# Patient Record
Sex: Female | Born: 1950 | Hispanic: No | Marital: Married | Smoking: Former smoker
Health system: Southern US, Community
[De-identification: ages and names within clinical notes are randomized; demographics above are authoritative.]

## PROBLEM LIST (undated history)

## (undated) DIAGNOSIS — K219 Gastro-esophageal reflux disease without esophagitis: Secondary | ICD-10-CM

## (undated) DIAGNOSIS — R7303 Prediabetes: Secondary | ICD-10-CM

## (undated) DIAGNOSIS — F32A Depression, unspecified: Secondary | ICD-10-CM

## (undated) DIAGNOSIS — E119 Type 2 diabetes mellitus without complications: Secondary | ICD-10-CM

## (undated) DIAGNOSIS — R739 Hyperglycemia, unspecified: Secondary | ICD-10-CM

## (undated) DIAGNOSIS — I10 Essential (primary) hypertension: Secondary | ICD-10-CM

## (undated) DIAGNOSIS — R0600 Dyspnea, unspecified: Secondary | ICD-10-CM

## (undated) DIAGNOSIS — F419 Anxiety disorder, unspecified: Secondary | ICD-10-CM

## (undated) HISTORY — PX: ELBOW SURGERY: SHX618

## (undated) HISTORY — DX: Essential (primary) hypertension: I10

## (undated) HISTORY — DX: Gastro-esophageal reflux disease without esophagitis: K21.9

## (undated) HISTORY — DX: Type 2 diabetes mellitus without complications: E11.9

## (undated) HISTORY — DX: Depression, unspecified: F32.A

## (undated) HISTORY — PX: CHOLECYSTECTOMY: SHX55

## (undated) HISTORY — DX: Hyperglycemia, unspecified: R73.9

---

## 2002-08-08 ENCOUNTER — Encounter: Payer: Self-pay | Admitting: Internal Medicine

## 2002-08-08 ENCOUNTER — Encounter: Admission: RE | Admit: 2002-08-08 | Discharge: 2002-08-08 | Payer: Self-pay | Admitting: Internal Medicine

## 2003-06-13 ENCOUNTER — Other Ambulatory Visit: Admission: RE | Admit: 2003-06-13 | Discharge: 2003-06-13 | Payer: Self-pay | Admitting: Obstetrics and Gynecology

## 2004-11-20 ENCOUNTER — Observation Stay (HOSPITAL_COMMUNITY): Admission: RE | Admit: 2004-11-20 | Discharge: 2004-11-21 | Payer: Self-pay | Admitting: Orthopedic Surgery

## 2013-03-14 ENCOUNTER — Ambulatory Visit
Admission: RE | Admit: 2013-03-14 | Discharge: 2013-03-14 | Disposition: A | Discharge status: Home / Self Care | Payer: 59 | Source: Ambulatory Visit | Attending: Family Medicine | Admitting: Family Medicine

## 2013-03-14 ENCOUNTER — Other Ambulatory Visit: Payer: Self-pay | Admitting: Family Medicine

## 2013-03-14 DIAGNOSIS — M25561 Pain in right knee: Secondary | ICD-10-CM

## 2019-06-13 ENCOUNTER — Other Ambulatory Visit: Payer: Self-pay | Admitting: Obstetrics and Gynecology

## 2019-06-13 DIAGNOSIS — R928 Other abnormal and inconclusive findings on diagnostic imaging of breast: Secondary | ICD-10-CM

## 2019-06-14 ENCOUNTER — Ambulatory Visit
Admission: RE | Admit: 2019-06-14 | Discharge: 2019-06-14 | Disposition: A | Discharge status: Home / Self Care | Payer: Medicare Other | Source: Ambulatory Visit | Attending: Obstetrics and Gynecology | Admitting: Obstetrics and Gynecology

## 2019-06-14 ENCOUNTER — Other Ambulatory Visit: Payer: Self-pay

## 2019-06-14 ENCOUNTER — Other Ambulatory Visit: Payer: Self-pay | Admitting: Obstetrics and Gynecology

## 2019-06-14 DIAGNOSIS — R928 Other abnormal and inconclusive findings on diagnostic imaging of breast: Secondary | ICD-10-CM

## 2019-06-14 DIAGNOSIS — N6310 Unspecified lump in the right breast, unspecified quadrant: Secondary | ICD-10-CM

## 2019-07-12 ENCOUNTER — Ambulatory Visit: Payer: Medicare Other | Attending: Internal Medicine

## 2019-07-12 DIAGNOSIS — Z23 Encounter for immunization: Secondary | ICD-10-CM

## 2019-07-12 NOTE — Progress Notes (Signed)
   Covid-19 Vaccination Clinic  Name:  Tyneisha Hegeman    MRN: [id number] DOB: [birth year]  07/12/2019  Ms. Seybold was observed post Covid-19 immunization for 15 minutes without incidence. She was provided with Vaccine Information Sheet and instruction to access the V-Safe system.   Ms. Pesantez was instructed to call 911 with any severe reactions post vaccine: Marland Kitchen Difficulty breathing  . Swelling of your face and throat  . A fast heartbeat  . A bad rash all over your body  . Dizziness and weakness    Immunizations Administered    Name Date Dose VIS Date Route   Pfizer COVID-19 Vaccine 07/12/2019  2:35 PM 0.3 mL 04/27/2019 Intramuscular   Manufacturer: [place]   Lot: [id number]   [employer]: [id number]

## 2019-08-07 ENCOUNTER — Ambulatory Visit: Payer: Medicare Other | Attending: Internal Medicine

## 2019-08-07 DIAGNOSIS — Z23 Encounter for immunization: Secondary | ICD-10-CM

## 2019-08-07 NOTE — Progress Notes (Signed)
   Covid-19 Vaccination Clinic  Name:  Kathryn [name]    MRN: [id number] DOB: 1950/09/27  08/07/2019  [name] was observed post Covid-19 immunization for 15 minutes without incident. She was provided with Vaccine Information Sheet and instruction to access the V-Safe system.   [name] was instructed to call 911 with any severe reactions post vaccine: Marland Kitchen Difficulty breathing  . Swelling of face and throat  . A fast heartbeat  . A bad rash all over body  . Dizziness and weakness   Immunizations Administered    Name Date Dose VIS Date Route   Pfizer COVID-19 Vaccine 08/07/2019  1:51 PM 0.3 mL 04/27/2019 Intramuscular   Manufacturer: [place]   Lot: [id number]   [place]: [id number]

## 2019-12-13 ENCOUNTER — Other Ambulatory Visit: Payer: Self-pay | Admitting: Obstetrics and Gynecology

## 2019-12-13 ENCOUNTER — Other Ambulatory Visit: Payer: Self-pay

## 2019-12-13 ENCOUNTER — Ambulatory Visit
Admission: RE | Admit: 2019-12-13 | Discharge: 2019-12-13 | Disposition: A | Discharge status: Home / Self Care | Payer: Medicare Other | Source: Ambulatory Visit | Attending: Obstetrics and Gynecology | Admitting: Obstetrics and Gynecology

## 2019-12-13 DIAGNOSIS — N6310 Unspecified lump in the right breast, unspecified quadrant: Secondary | ICD-10-CM

## 2020-06-18 ENCOUNTER — Other Ambulatory Visit: Payer: Self-pay

## 2020-06-18 ENCOUNTER — Ambulatory Visit
Admission: RE | Admit: 2020-06-18 | Discharge: 2020-06-18 | Disposition: A | Discharge status: Home / Self Care | Payer: Medicare Other | Source: Ambulatory Visit | Attending: Obstetrics and Gynecology | Admitting: Obstetrics and Gynecology

## 2020-06-18 DIAGNOSIS — N6310 Unspecified lump in the right breast, unspecified quadrant: Secondary | ICD-10-CM

## 2020-06-18 DIAGNOSIS — R922 Inconclusive mammogram: Secondary | ICD-10-CM | POA: Diagnosis not present

## 2020-06-18 DIAGNOSIS — N6311 Unspecified lump in the right breast, upper outer quadrant: Secondary | ICD-10-CM | POA: Diagnosis not present

## 2020-06-18 DIAGNOSIS — N6313 Unspecified lump in the right breast, lower outer quadrant: Secondary | ICD-10-CM | POA: Diagnosis not present

## 2020-07-01 DIAGNOSIS — H60503 Unspecified acute noninfective otitis externa, bilateral: Secondary | ICD-10-CM | POA: Diagnosis not present

## 2020-08-08 DIAGNOSIS — E119 Type 2 diabetes mellitus without complications: Secondary | ICD-10-CM | POA: Diagnosis not present

## 2020-08-08 DIAGNOSIS — E559 Vitamin D deficiency, unspecified: Secondary | ICD-10-CM | POA: Diagnosis not present

## 2020-08-08 DIAGNOSIS — Z7984 Long term (current) use of oral hypoglycemic drugs: Secondary | ICD-10-CM | POA: Diagnosis not present

## 2020-08-08 DIAGNOSIS — I10 Essential (primary) hypertension: Secondary | ICD-10-CM | POA: Diagnosis not present

## 2020-08-08 DIAGNOSIS — N189 Chronic kidney disease, unspecified: Secondary | ICD-10-CM | POA: Diagnosis not present

## 2020-08-08 DIAGNOSIS — E785 Hyperlipidemia, unspecified: Secondary | ICD-10-CM | POA: Diagnosis not present

## 2020-08-18 ENCOUNTER — Other Ambulatory Visit (HOSPITAL_BASED_OUTPATIENT_CLINIC_OR_DEPARTMENT_OTHER): Payer: Self-pay | Admitting: Family Medicine

## 2020-08-18 DIAGNOSIS — R932 Abnormal findings on diagnostic imaging of liver and biliary tract: Secondary | ICD-10-CM

## 2020-08-20 ENCOUNTER — Ambulatory Visit (HOSPITAL_BASED_OUTPATIENT_CLINIC_OR_DEPARTMENT_OTHER)
Admission: RE | Admit: 2020-08-20 | Discharge: 2020-08-20 | Disposition: A | Discharge status: Home / Self Care | Payer: Medicare Other | Source: Ambulatory Visit | Attending: Family Medicine | Admitting: Family Medicine

## 2020-08-20 ENCOUNTER — Other Ambulatory Visit: Payer: Self-pay

## 2020-08-20 DIAGNOSIS — K760 Fatty (change of) liver, not elsewhere classified: Secondary | ICD-10-CM | POA: Diagnosis not present

## 2020-08-20 DIAGNOSIS — R932 Abnormal findings on diagnostic imaging of liver and biliary tract: Secondary | ICD-10-CM | POA: Diagnosis not present

## 2020-08-21 DIAGNOSIS — R945 Abnormal results of liver function studies: Secondary | ICD-10-CM | POA: Diagnosis not present

## 2020-08-27 DIAGNOSIS — R945 Abnormal results of liver function studies: Secondary | ICD-10-CM | POA: Diagnosis not present

## 2020-10-07 DIAGNOSIS — R6889 Other general symptoms and signs: Secondary | ICD-10-CM | POA: Diagnosis not present

## 2020-10-20 DIAGNOSIS — R7303 Prediabetes: Secondary | ICD-10-CM | POA: Diagnosis not present

## 2020-10-20 DIAGNOSIS — I10 Essential (primary) hypertension: Secondary | ICD-10-CM | POA: Diagnosis not present

## 2020-10-20 DIAGNOSIS — K760 Fatty (change of) liver, not elsewhere classified: Secondary | ICD-10-CM | POA: Diagnosis not present

## 2020-10-20 DIAGNOSIS — Z87891 Personal history of nicotine dependence: Secondary | ICD-10-CM | POA: Diagnosis not present

## 2020-10-20 DIAGNOSIS — K7402 Hepatic fibrosis, advanced fibrosis: Secondary | ICD-10-CM | POA: Diagnosis not present

## 2020-10-20 DIAGNOSIS — R945 Abnormal results of liver function studies: Secondary | ICD-10-CM | POA: Diagnosis not present

## 2020-10-20 DIAGNOSIS — R7989 Other specified abnormal findings of blood chemistry: Secondary | ICD-10-CM | POA: Diagnosis not present

## 2020-10-20 DIAGNOSIS — E785 Hyperlipidemia, unspecified: Secondary | ICD-10-CM | POA: Diagnosis not present

## 2020-10-22 DIAGNOSIS — K760 Fatty (change of) liver, not elsewhere classified: Secondary | ICD-10-CM | POA: Diagnosis not present

## 2020-10-22 DIAGNOSIS — R918 Other nonspecific abnormal finding of lung field: Secondary | ICD-10-CM | POA: Diagnosis not present

## 2020-10-22 DIAGNOSIS — Z87891 Personal history of nicotine dependence: Secondary | ICD-10-CM | POA: Diagnosis not present

## 2020-10-22 DIAGNOSIS — R058 Other specified cough: Secondary | ICD-10-CM | POA: Diagnosis not present

## 2020-10-22 DIAGNOSIS — E785 Hyperlipidemia, unspecified: Secondary | ICD-10-CM | POA: Diagnosis not present

## 2020-10-22 DIAGNOSIS — R0982 Postnasal drip: Secondary | ICD-10-CM | POA: Diagnosis not present

## 2020-10-22 DIAGNOSIS — R945 Abnormal results of liver function studies: Secondary | ICD-10-CM | POA: Diagnosis not present

## 2020-10-22 DIAGNOSIS — I10 Essential (primary) hypertension: Secondary | ICD-10-CM | POA: Diagnosis not present

## 2020-10-22 DIAGNOSIS — R7303 Prediabetes: Secondary | ICD-10-CM | POA: Diagnosis not present

## 2020-10-23 DIAGNOSIS — R945 Abnormal results of liver function studies: Secondary | ICD-10-CM | POA: Diagnosis not present

## 2020-12-23 DIAGNOSIS — R6889 Other general symptoms and signs: Secondary | ICD-10-CM | POA: Diagnosis not present

## 2021-02-02 DIAGNOSIS — E119 Type 2 diabetes mellitus without complications: Secondary | ICD-10-CM | POA: Diagnosis not present

## 2021-02-10 DIAGNOSIS — R252 Cramp and spasm: Secondary | ICD-10-CM | POA: Diagnosis not present

## 2021-02-10 DIAGNOSIS — E119 Type 2 diabetes mellitus without complications: Secondary | ICD-10-CM | POA: Diagnosis not present

## 2021-02-10 DIAGNOSIS — E785 Hyperlipidemia, unspecified: Secondary | ICD-10-CM | POA: Diagnosis not present

## 2021-02-10 DIAGNOSIS — Z23 Encounter for immunization: Secondary | ICD-10-CM | POA: Diagnosis not present

## 2021-02-10 DIAGNOSIS — K760 Fatty (change of) liver, not elsewhere classified: Secondary | ICD-10-CM | POA: Diagnosis not present

## 2021-02-10 DIAGNOSIS — I10 Essential (primary) hypertension: Secondary | ICD-10-CM | POA: Diagnosis not present

## 2021-02-10 DIAGNOSIS — Z7984 Long term (current) use of oral hypoglycemic drugs: Secondary | ICD-10-CM | POA: Diagnosis not present

## 2021-07-30 DIAGNOSIS — Z03818 Encounter for observation for suspected exposure to other biological agents ruled out: Secondary | ICD-10-CM | POA: Diagnosis not present

## 2021-07-30 DIAGNOSIS — R051 Acute cough: Secondary | ICD-10-CM | POA: Diagnosis not present

## 2021-07-30 DIAGNOSIS — R509 Fever, unspecified: Secondary | ICD-10-CM | POA: Diagnosis not present

## 2021-07-30 DIAGNOSIS — J029 Acute pharyngitis, unspecified: Secondary | ICD-10-CM | POA: Diagnosis not present

## 2021-08-12 DIAGNOSIS — Z1231 Encounter for screening mammogram for malignant neoplasm of breast: Secondary | ICD-10-CM | POA: Diagnosis not present

## 2021-08-12 DIAGNOSIS — E785 Hyperlipidemia, unspecified: Secondary | ICD-10-CM | POA: Diagnosis not present

## 2021-08-12 DIAGNOSIS — K7689 Other specified diseases of liver: Secondary | ICD-10-CM | POA: Diagnosis not present

## 2021-08-12 DIAGNOSIS — Z122 Encounter for screening for malignant neoplasm of respiratory organs: Secondary | ICD-10-CM | POA: Diagnosis not present

## 2021-08-12 DIAGNOSIS — D126 Benign neoplasm of colon, unspecified: Secondary | ICD-10-CM | POA: Diagnosis not present

## 2021-08-12 DIAGNOSIS — I10 Essential (primary) hypertension: Secondary | ICD-10-CM | POA: Diagnosis not present

## 2021-08-12 DIAGNOSIS — Z7984 Long term (current) use of oral hypoglycemic drugs: Secondary | ICD-10-CM | POA: Diagnosis not present

## 2021-08-12 DIAGNOSIS — E119 Type 2 diabetes mellitus without complications: Secondary | ICD-10-CM | POA: Diagnosis not present

## 2021-08-31 DIAGNOSIS — Z86010 Personal history of colonic polyps: Secondary | ICD-10-CM | POA: Diagnosis not present

## 2021-08-31 DIAGNOSIS — K760 Fatty (change of) liver, not elsewhere classified: Secondary | ICD-10-CM | POA: Diagnosis not present

## 2021-09-01 DIAGNOSIS — Z87891 Personal history of nicotine dependence: Secondary | ICD-10-CM | POA: Diagnosis not present

## 2021-09-06 NOTE — Progress Notes (Signed)
?  ?Cardiology Office Note ? ? ?Date:  09/07/2021  ? ?ID:  Kathryn [name], DOB [birth year], MRN [id number] ? ?PCP:  Kathryn Dials, MD  ?Cardiologist:   Kathryn Breeding, MD ?Referring:  Kathryn Dials, MD ? ?Chief Complaint  ?Patient presents with  ? Shortness of Breath  ? ? ?  ?History of Present Illness: ?Kathryn [name] is a 71 y.o. female who presents for evaluation of SOB.    The patient has no past cardiac history other than a stress test years ago. Her sister had CAD starting at age 74.  She has other risk factors as listed below.  She does have DOE.  This happens after 100 yards level ground or up a flight of stairs.  The patient denies any new symptoms such as chest discomfort, neck or arm discomfort. There has been no  PND or orthopnea. There have been no reported palpitations, presyncope or syncope.  ? ?Of note she found out last week that a CT done for lung cancer screening demonstrated a left upper lobe 33 mm nodule.  I do not see mention of any coronary calcium.      ? ? ?Past Medical History:  ?Diagnosis Date  ? Depression   ? Diabetes ([place])   ? GERD (gastroesophageal reflux disease)   ? HTN (hypertension)   ? Hyperglycemia   ? ? ?Past Surgical History:  ?Procedure Laterality Date  ? CHOLECYSTECTOMY    ? ELBOW SURGERY    ? ? ? ?Current Outpatient Medications  ?Medication Sig Dispense Refill  ? ALPRAZolam (XANAX) 0.25 MG tablet as needed.    ? aspirin 81 MG chewable tablet 1 tablet    ? atorvastatin (LIPITOR) 10 MG tablet Take 1 tablet by mouth daily.    ? Cholecalciferol (VITAMIN D3) 10 MCG (400 UNIT) CAPS     ? Fish Oil-Krill Oil (KRILL & FISH OIL BLEND PO)     ? fluticasone (FLONASE) 50 MCG/ACT nasal spray 2 sprays by Each Nare route daily.    ? Magnesium 250 MG TABS Take by mouth.    ? metFORMIN (GLUCOPHAGE) 500 MG tablet TAKE 1 TABLET BY MOUTH ONCE DAILY PRIOR TO EVENING MEAL    ? Multiple Vitamins-Minerals (MULTIVITAMIN ADULTS 50+ PO)     ? triamcinolone cream (KENALOG) 0.5 % 1 application    ?  triamterene-hydrochlorothiazide (DYAZIDE) 37.5-25 MG capsule TAKE 1 CAPSULE BY MOUTH IN  THE MORNING ONCE DAILY    ? verapamil (CALAN-SR) 120 MG CR tablet Take 1 tablet by mouth 2 (two) times daily.    ? ?No current facility-administered medications for this visit.  ? ? ?Allergies:   Patient has no allergy information on record.  ? ? ?Social History:  The patient  reports that she has quit smoking. Her smoking use included cigarettes. She has never used smokeless tobacco.  ? ?Family History:  The patient's family history includes Coronary artery disease (age of onset: 88) in her sister; Heart failure in her mother.  ? ? ?ROS:  Please see the history of present illness.   Otherwise, review of systems are positive for none.   All other systems are reviewed and negative.  ? ? ?PHYSICAL EXAM: ?VS:  BP (!) 142/88   Pulse 69   Ht 5' 1.75" (1.568 m)   Wt 246 lb 3.2 oz (111.7 kg)   SpO2 94%   BMI 45.40 kg/m?  , BMI Body mass index is 45.4 kg/m?. ?GENERAL:  Well appearing ?HEENT:  Pupils equal round and reactive,  fundi not visualized, oral mucosa unremarkable ?NECK:  No jugular venous distention, waveform within normal limits, carotid upstroke brisk and symmetric, no bruits, no thyromegaly ?LYMPHATICS:  No cervical, inguinal adenopathy ?LUNGS:  Clear to auscultation bilaterally ?BACK:  No CVA tenderness ?CHEST:  Unremarkable ?HEART:  PMI not displaced or sustained,S1 and S2 within normal limits, no S3, no S4, no clicks, no rubs, no murmurs ?ABD:  Flat, positive bowel sounds normal in frequency in pitch, no bruits, no rebound, no guarding, no midline pulsatile mass, no hepatomegaly, no splenomegaly ?EXT:  2 plus pulses throughout, no edema, no cyanosis no clubbing ?SKIN:  No rashes no nodules ?NEURO:  Cranial nerves II through XII grossly intact, motor grossly intact throughout ?PSYCH:  Cognitively intact, oriented to person place and time ? ? ? ?EKG:  EKG is ordered today. ?The ekg ordered today demonstrates sinus  rhythm, rate 69, axis within normal limits, intervals within normal limits, changes. ? ? ?Recent Labs: ?No results found for requested labs within last 8760 hours.  ? ? ?Lipid Panel ?No results found for: CHOL, TRIG, HDL, CHOLHDL, VLDL, LDLCALC, LDLDIRECT ?  ? ?Wt Readings from Last 3 Encounters:  ?09/07/21 246 lb 3.2 oz (111.7 kg)  ?  ? ? ?Other studies Reviewed: ?Additional studies/ records that were reviewed today include: Office records and labs. ?Review of the above records demonstrates:  Please see elsewhere in the note.   ? ? ?ASSESSMENT AND PLAN: ? ?HTN:     The blood pressure is at target. No change in medications is indicated. We will continue with therapeutic lifestyle changes (TLC). ? ?SOB: This could be an anginal equivalent.   The pretest probability of obstructive CAD is lower but she has significant risk factors.  I will bring the patient back for a POET (Plain Old Exercise Test). This will allow me to screen for obstructive coronary disease, risk stratify and very importantly provide a prescription for exercise.  ? ?DM:  A1C is 6.0.  No change in therapy ? ?DYSLIPIDEMIA:  LDL was 40 and HDL 60.  Continue current therapy.  ? ?Current medicines are reviewed at length with the patient today.  The patient does not have concerns regarding medicines. ? ?The following changes have been made:  no change ? ?Labs/ tests ordered today include:  ? ?Orders Placed This Encounter  ?Procedures  ? EXERCISE TOLERANCE TEST (ETT)  ? EKG 12-Lead  ? ? ? ?Disposition:   FU with me as needed.    ? ? ?Signed, ?Kathryn Breeding, MD  ?09/07/2021 6:06 PM    ?[place] ? ? ? ?

## 2021-09-07 ENCOUNTER — Ambulatory Visit: Payer: Medicare Other | Admitting: Cardiology

## 2021-09-07 ENCOUNTER — Encounter: Payer: Self-pay | Admitting: Cardiology

## 2021-09-07 ENCOUNTER — Telehealth: Payer: Self-pay | Admitting: Cardiology

## 2021-09-07 VITALS — BP 142/88 | HR 69 | Ht 61.75 in | Wt 246.2 lb

## 2021-09-07 DIAGNOSIS — R0602 Shortness of breath: Secondary | ICD-10-CM | POA: Diagnosis not present

## 2021-09-07 DIAGNOSIS — R739 Hyperglycemia, unspecified: Secondary | ICD-10-CM | POA: Diagnosis not present

## 2021-09-07 DIAGNOSIS — E785 Hyperlipidemia, unspecified: Secondary | ICD-10-CM

## 2021-09-07 DIAGNOSIS — I10 Essential (primary) hypertension: Secondary | ICD-10-CM | POA: Diagnosis not present

## 2021-09-07 NOTE — Telephone Encounter (Signed)
[name] wanted to make Dr. Percival Spanish aware that they recently learned pt has a Lung Mass. Pt has a appt this morning. Please advise ?

## 2021-09-07 NOTE — Patient Instructions (Signed)
Medication Instructions:  ?Your Physician recommend you continue on your current medication as directed.   ? ?*If you need a refill on your cardiac medications before your next appointment, please call your pharmacy* ? ? ?Lab Work: ?None ordered today ? ? ?Testing/Procedures: ?Your physician has requested that you have an exercise tolerance test. For further information please visit [email or web address]. Please also follow instruction sheet, as given. ?[place] ? ? ?Follow-Up: ?At [facility], you and your health needs are our priority.  As part of our continuing mission to provide you with exceptional heart care, we have created designated Provider Care Teams.  These Care Teams include your primary Cardiologist (physician) and Advanced Practice Providers (APPs -  Physician Assistants and Nurse Practitioners) who all work together to provide you with the care you need, when you need it. ? ?We recommend signing up for the patient portal called "MyChart".  Sign up information is provided on this After Visit Summary.  MyChart is used to connect with patients for Virtual Visits (Telemedicine).  Patients are able to view lab/test results, encounter notes, upcoming appointments, etc.  Non-urgent messages can be sent to your provider as well.   ?To learn more about what you can do with MyChart, go to [email or web address].   ? ?Your next appointment:   ?As needed ? ?The format for your next appointment:   ?In Person ? ?Provider:   ?Minus Breeding, MD { ? ? ? ?[facility] ?[address], [address] ?[place], [place] [address] ?Phone:  [phone] ? ?  ?  ? ? ?You are scheduled for an Exercise Stress Test  ? ?Please arrive 15 minutes prior to your appointment time for registration and insurance purposes. ? ?The test will take approximately 45 minutes to complete. ? ?How to prepare for your Exercise Stress Test: ?Do bring a list of your current medications  with you.  If not listed below, you may take your medications as normal. ?Do wear comfortable clothes (no dresses or overalls) and walking shoes, tennis shoes preferred (no heels or open toed shoes are allowed) ?Do Not wear cologne, perfume, aftershave or lotions (deodorant is allowed). ?Please report to [place], [address] for your test. ? ?If these instructions are not followed, your test will have to be rescheduled. ? ?If you have questions or concerns about your appointment, you can call the Stress Lab at [phone]. ? ?If you cannot keep your appointment, please provide 24 hours notification to the Stress Lab, to avoid a possible $50 charge to your account ? ? ?Important Information About Sugar ? ? ? ? ? ? ?

## 2021-09-07 NOTE — Telephone Encounter (Signed)
Spoke with Hinton Dyer at [place] regarding pt's recent CT that showed a lung mass. Pt is aware of diagnosis. CT results uploaded to proficient. CT printed and taken to Dr. Percival Spanish to review and discuss with pt at upcoming appointment later today.  ?

## 2021-09-14 ENCOUNTER — Ambulatory Visit: Payer: Medicare Other | Admitting: Pulmonary Disease

## 2021-09-14 ENCOUNTER — Encounter: Payer: Self-pay | Admitting: Pulmonary Disease

## 2021-09-14 VITALS — BP 118/70 | HR 64 | Temp 98.0°F | Ht 61.0 in | Wt 249.6 lb

## 2021-09-14 DIAGNOSIS — R911 Solitary pulmonary nodule: Secondary | ICD-10-CM | POA: Diagnosis not present

## 2021-09-14 DIAGNOSIS — Z720 Tobacco use: Secondary | ICD-10-CM | POA: Diagnosis not present

## 2021-09-14 DIAGNOSIS — Z87891 Personal history of nicotine dependence: Secondary | ICD-10-CM

## 2021-09-14 NOTE — Progress Notes (Signed)
? ?Synopsis: Referred in May 2023 for lung nodule by Aura Dials, MD ? ?Subjective:  ? ?PATIENT ID: Kathryn [name] GENDER: female DOB: 05-Feb-1951, MRN: [id number] ? ?Chief Complaint  ?Patient presents with  ? Consult  ?  Patient is here to talk about a lung nodule.   ? ? ?This is a 71 year old female, past medical history of diabetes, hypertension, gastroesophageal reflux.  Patient had a lung cancer screening CT completed on 09/01/2021 which revealed a left upper lobe 30 mm pulmonary nodule concerning for malignancy.  Patient was referred here for evaluation and to discuss next steps.  She quit smoking 4 years ago.  She does however have a longstanding history of tobacco use.  She has not had any diagnosis of cancers in the past 5 years.  She does occasionally have a mildly productive cough.  She has not had any PFTs.  Never been told she had COPD.  Her cardiologist also has her planned for a stress test. ? ? ?Past Medical History:  ?Diagnosis Date  ? Depression   ? Diabetes ([place])   ? GERD (gastroesophageal reflux disease)   ? HTN (hypertension)   ? Hyperglycemia   ?  ? ?Family History  ?Problem Relation Age of Onset  ? Heart failure Mother   ? Coronary artery disease Sister 62  ?  ? ?Past Surgical History:  ?Procedure Laterality Date  ? CHOLECYSTECTOMY    ? ELBOW SURGERY    ? ? ?Social History  ? ?Socioeconomic History  ? Marital status: Married  ?  Spouse name: Not on file  ? Number of children: Not on file  ? Years of education: Not on file  ? Highest education level: Not on file  ?Occupational History  ? Not on file  ?Tobacco Use  ? Smoking status: Former  ?  Types: Cigarettes  ? Smokeless tobacco: Never  ?Substance and Sexual Activity  ? Alcohol use: Not on file  ? Drug use: Not on file  ? Sexual activity: Not on file  ?Other Topics Concern  ? Not on file  ?Social History Narrative  ? Lives with her husband.  Together they have six children.  ? ?Social Determinants of Health  ? ?Financial Resource Strain: Not  on file  ?Food Insecurity: Not on file  ?Transportation Needs: Not on file  ?Physical Activity: Not on file  ?Stress: Not on file  ?Social Connections: Not on file  ?Intimate Partner Violence: Not on file  ?  ? ?Not on File  ? ?Outpatient Medications Prior to Visit  ?Medication Sig Dispense Refill  ? ALPRAZolam (XANAX) 0.25 MG tablet as needed.    ? aspirin 81 MG chewable tablet 1 tablet    ? atorvastatin (LIPITOR) 10 MG tablet Take 1 tablet by mouth daily.    ? Cholecalciferol (VITAMIN D3) 10 MCG (400 UNIT) CAPS     ? Fish Oil-Krill Oil (KRILL & FISH OIL BLEND PO)     ? fluticasone (FLONASE) 50 MCG/ACT nasal spray 2 sprays by Each Nare route daily.    ? Magnesium 250 MG TABS Take by mouth.    ? metFORMIN (GLUCOPHAGE) 500 MG tablet TAKE 1 TABLET BY MOUTH ONCE DAILY PRIOR TO EVENING MEAL    ? Multiple Vitamins-Minerals (MULTIVITAMIN ADULTS 50+ PO)     ? triamcinolone cream (KENALOG) 0.5 % 1 application    ? triamterene-hydrochlorothiazide (DYAZIDE) 37.5-25 MG capsule TAKE 1 CAPSULE BY MOUTH IN  THE MORNING ONCE DAILY    ? verapamil (CALAN-SR) 120  MG CR tablet Take 1 tablet by mouth 2 (two) times daily.    ? ?No facility-administered medications prior to visit.  ? ? ?Review of Systems  ?Constitutional:  Negative for chills, fever, malaise/fatigue and weight loss.  ?HENT:  Negative for hearing loss, sore throat and tinnitus.   ?Eyes:  Negative for blurred vision and double vision.  ?Respiratory:  Positive for cough and sputum production. Negative for hemoptysis, shortness of breath, wheezing and stridor.   ?Cardiovascular:  Negative for chest pain, palpitations, orthopnea, leg swelling and PND.  ?Gastrointestinal:  Negative for abdominal pain, constipation, diarrhea, heartburn, nausea and vomiting.  ?Genitourinary:  Negative for dysuria, hematuria and urgency.  ?Musculoskeletal:  Negative for joint pain and myalgias.  ?Skin:  Negative for itching and rash.  ?Neurological:  Negative for dizziness, tingling, weakness and  headaches.  ?Endo/Heme/Allergies:  Negative for environmental allergies. Does not bruise/bleed easily.  ?Psychiatric/Behavioral:  Negative for depression. The patient is not nervous/anxious and does not have insomnia.   ?All other systems reviewed and are negative. ? ? ?Objective:  ?Physical Exam ?Vitals reviewed.  ?Constitutional:   ?   General: She is not in acute distress. ?   Appearance: She is well-developed. She is obese.  ?HENT:  ?   Head: Normocephalic and atraumatic.  ?Eyes:  ?   General: No scleral icterus. ?   Conjunctiva/sclera: Conjunctivae normal.  ?   Pupils: Pupils are equal, round, and reactive to light.  ?Neck:  ?   Vascular: No JVD.  ?   Trachea: No tracheal deviation.  ?Cardiovascular:  ?   Rate and Rhythm: Normal rate and regular rhythm.  ?   Heart sounds: Normal heart sounds. No murmur heard. ?Pulmonary:  ?   Effort: Pulmonary effort is normal. No tachypnea, accessory muscle usage or respiratory distress.  ?   Breath sounds: No stridor. No wheezing, rhonchi or rales.  ?   Comments: Clear bilaterally no wheeze ?Abdominal:  ?   General: There is no distension.  ?   Palpations: Abdomen is soft.  ?   Tenderness: There is no abdominal tenderness.  ?Musculoskeletal:     ?   General: No tenderness.  ?   Cervical back: Neck supple.  ?Lymphadenopathy:  ?   Cervical: No cervical adenopathy.  ?Skin: ?   General: Skin is warm and dry.  ?   Capillary Refill: Capillary refill takes less than 2 seconds.  ?   Findings: No rash.  ?Neurological:  ?   Mental Status: She is alert and oriented to person, place, and time.  ?Psychiatric:     ?   Behavior: Behavior normal.  ? ? ? ?Vitals:  ? 09/14/21 1331  ?BP: 118/70  ?Pulse: 64  ?Temp: 98 ?F (36.7 ?C)  ?TempSrc: Oral  ?SpO2: 95%  ?Weight: 249 lb 9.6 oz (113.2 kg)  ?Height: 5\' 1"  (1.549 m)  ? ?95% on RA ?BMI Readings from Last 3 Encounters:  ?09/14/21 47.16 kg/m?  ?09/07/21 45.40 kg/m?  ? ?Wt Readings from Last 3 Encounters:  ?09/14/21 249 lb 9.6 oz (113.2 kg)   ?09/07/21 246 lb 3.2 oz (111.7 kg)  ? ? ? ?CBC ?No results found for: WBC, RBC, HGB, HCT, PLT, MCV, MCH, MCHC, RDW, LYMPHSABS, MONOABS, EOSABS, BASOSABS ? ? ?Chest Imaging: ?April 2023 lung cancer screening CT: ?30 mm left upper lobe pulmonary nodule ?Report reviewed in epic care everywhere completed at [facility]. ? ?Pulmonary Functions Testing Results: ?   ? View : No data to display.  ?  ?  ?  ? ? ?  FeNO:  ? ?Pathology:  ? ?Echocardiogram:  ? ?Heart Catheterization:  ?   ?Assessment & Plan:  ? ?  ICD-10-CM   ?1. Left upper lobe pulmonary nodule  R91.1 NM PET Image Initial (PI) Skull Base To Thigh (F-18 FDG)  ?  Pulmonary Function Test  ?  ?2. Tobacco abuse  Z72.0   ?  ?3. Former smoker  Z87.891   ?  ? ? ?Discussion: ? ?This is a 71 year old female, left upper lobe pulmonary nodule, tobacco abuse, former smoker. ? ?Plan: ?I think it be best for [place] to get a PET scan before consideration for tissue biopsy. ?If she has no lesion anywhere else within the lung she potentially could be a candidate for resection. ?She did quit smoking 4 years ago.  She has not had pulmonary function test either. ?Based upon the PET scan results if there is no evidence of disease anywhere else in the lesion is concerning for malignancy and her pulmonary function tests are amendable to surgery then we will refer to thoracic surgery for consideration of resection. ?If the lesion is equivocal on PET scan or her pulmonary function tests are not okay for surgery then we would consider robotic assisted navigational bronchoscopy and tissue sampling. ?Patient is agreeable to this plan. ?We will see her back in a few weeks after the PET and PFTs are complete. ? ? ?Current Outpatient Medications:  ?  ALPRAZolam (XANAX) 0.25 MG tablet, as needed., Disp: , Rfl:  ?  aspirin 81 MG chewable tablet, 1 tablet, Disp: , Rfl:  ?  atorvastatin (LIPITOR) 10 MG tablet, Take 1 tablet by mouth daily., Disp: , Rfl:  ?  Cholecalciferol (VITAMIN D3) 10 MCG (400 UNIT)  CAPS, , Disp: , Rfl:  ?  Fish Oil-Krill Oil (KRILL & FISH OIL BLEND PO), , Disp: , Rfl:  ?  fluticasone (FLONASE) 50 MCG/ACT nasal spray, 2 sprays by Each Nare route daily., Disp: , Rfl:  ?  Magnesium 250 MG

## 2021-09-14 NOTE — Patient Instructions (Signed)
Thank you for visiting Dr. Valeta Harms at [facility]. ?Today we recommend the following: ? ?Orders Placed This Encounter  ?Procedures  ? NM PET Image Initial (PI) Skull Base To Thigh (F-18 FDG)  ? Pulmonary Function Test  ? ?After your PET and PFTs.  ? ?Return in about 3 weeks (around 10/05/2021) for with Eric Form, NP, or Dr. Valeta Harms. ? ? ? ?Please do your part to reduce the spread of COVID-19. ? ?

## 2021-09-15 ENCOUNTER — Telehealth: Payer: Self-pay | Admitting: Pulmonary Disease

## 2021-09-15 NOTE — Telephone Encounter (Signed)
She reports that she gets very panicked and wants to know if you can send something to help her with her Anxiety. She gets very anxious in small places and would need something to calm her nerves. She is aware to have a driver for the procedure with medication. Please send to [facility].  ?

## 2021-09-17 ENCOUNTER — Ambulatory Visit (HOSPITAL_COMMUNITY)
Admission: RE | Admit: 2021-09-17 | Discharge: 2021-09-17 | Disposition: A | Discharge status: Home / Self Care | Payer: Medicare Other | Source: Ambulatory Visit | Attending: Pulmonary Disease | Admitting: Pulmonary Disease

## 2021-09-17 DIAGNOSIS — R911 Solitary pulmonary nodule: Secondary | ICD-10-CM | POA: Diagnosis not present

## 2021-09-17 DIAGNOSIS — R0600 Dyspnea, unspecified: Secondary | ICD-10-CM | POA: Insufficient documentation

## 2021-09-17 DIAGNOSIS — Z87891 Personal history of nicotine dependence: Secondary | ICD-10-CM | POA: Diagnosis not present

## 2021-09-17 DIAGNOSIS — R059 Cough, unspecified: Secondary | ICD-10-CM | POA: Diagnosis not present

## 2021-09-17 DIAGNOSIS — R062 Wheezing: Secondary | ICD-10-CM | POA: Insufficient documentation

## 2021-09-17 LAB — PULMONARY FUNCTION TEST
DL/VA % pred: 110 %
DL/VA: 4.69 ml/min/mmHg/L
DLCO cor % pred: 97 %
DLCO cor: 17.15 ml/min/mmHg
DLCO unc % pred: 103 %
DLCO unc: 18.12 ml/min/mmHg
FEF 25-75 Post: 1.21 L/sec
FEF 25-75 Pre: 1.44 L/sec
FEF2575-%Change-Post: -16 %
FEF2575-%Pred-Post: 69 %
FEF2575-%Pred-Pre: 83 %
FEV1-%Change-Post: -3 %
FEV1-%Pred-Post: 76 %
FEV1-%Pred-Pre: 79 %
FEV1-Post: 1.52 L
FEV1-Pre: 1.58 L
FEV1FVC-%Change-Post: -3 %
FEV1FVC-%Pred-Pre: 101 %
FEV6-%Change-Post: 2 %
FEV6-%Pred-Post: 81 %
FEV6-%Pred-Pre: 79 %
FEV6-Post: 2.05 L
FEV6-Pre: 2 L
FEV6FVC-%Pred-Post: 104 %
FEV6FVC-%Pred-Pre: 104 %
FVC-%Change-Post: 0 %
FVC-%Pred-Post: 77 %
FVC-%Pred-Pre: 77 %
FVC-Post: 2.05 L
FVC-Pre: 2.05 L
Post FEV1/FVC ratio: 74 %
Post FEV6/FVC ratio: 100 %
Pre FEV1/FVC ratio: 77 %
Pre FEV6/FVC Ratio: 100 %

## 2021-09-17 MED ORDER — ALBUTEROL SULFATE (2.5 MG/3ML) 0.083% IN NEBU
2.5000 mg | INHALATION_SOLUTION | Freq: Once | RESPIRATORY_TRACT | Status: AC
  Administered 2021-09-17: 2.5 mg via RESPIRATORY_TRACT

## 2021-09-21 MED ORDER — DIAZEPAM 5 MG PO TABS
5.0000 mg | ORAL_TABLET | ORAL | 0 refills | Status: DC | PRN
Start: 2021-09-21 — End: 2021-10-24

## 2021-09-21 NOTE — Telephone Encounter (Signed)
PCCM: ? ?Orders placed for 5 mg Valium, p.o. to be taken 30 minutes prior to imaging.  Please instruct patient that she will need to have someone take her to and from her imaging appointment. ? ?Garner Nash, DO ? Pulmonary Critical Care ?09/21/2021 8:17 AM   ? ?

## 2021-09-21 NOTE — Telephone Encounter (Signed)
Spoke with the pt and notified of recommendations from Dr Valeta Harms. She verbalized understanding. Nothing further needed. She will have someone drive her to and from appt.  ?

## 2021-09-23 ENCOUNTER — Telehealth (HOSPITAL_COMMUNITY): Payer: Self-pay | Admitting: *Deleted

## 2021-09-23 NOTE — Telephone Encounter (Signed)
Close encounter 

## 2021-09-24 ENCOUNTER — Ambulatory Visit (HOSPITAL_COMMUNITY)
Admission: RE | Admit: 2021-09-24 | Discharge: 2021-09-24 | Disposition: A | Discharge status: Home / Self Care | Payer: Medicare Other | Source: Ambulatory Visit | Attending: Cardiovascular Disease | Admitting: Cardiovascular Disease

## 2021-09-24 DIAGNOSIS — R0602 Shortness of breath: Secondary | ICD-10-CM | POA: Diagnosis not present

## 2021-09-24 LAB — EXERCISE TOLERANCE TEST
Angina Index: 0
Duke Treadmill Score: 3
Estimated workload: 4.6
Exercise duration (min): 3 min
Exercise duration (sec): 0 s
MPHR: 150 {beats}/min
Peak HR: 144 {beats}/min
Percent HR: 96 %
Rest HR: 85 {beats}/min
ST Depression (mm): 0 mm

## 2021-09-28 ENCOUNTER — Encounter (HOSPITAL_COMMUNITY)
Admission: RE | Admit: 2021-09-28 | Discharge: 2021-09-28 | Disposition: A | Discharge status: Home / Self Care | Payer: Medicare Other | Source: Ambulatory Visit | Attending: Pulmonary Disease | Admitting: Pulmonary Disease

## 2021-09-28 DIAGNOSIS — R911 Solitary pulmonary nodule: Secondary | ICD-10-CM | POA: Diagnosis not present

## 2021-09-28 DIAGNOSIS — K5730 Diverticulosis of large intestine without perforation or abscess without bleeding: Secondary | ICD-10-CM | POA: Diagnosis not present

## 2021-09-28 DIAGNOSIS — I700 Atherosclerosis of aorta: Secondary | ICD-10-CM | POA: Diagnosis not present

## 2021-09-28 DIAGNOSIS — I2510 Atherosclerotic heart disease of native coronary artery without angina pectoris: Secondary | ICD-10-CM | POA: Diagnosis not present

## 2021-09-28 LAB — GLUCOSE, CAPILLARY: Glucose-Capillary: 125 mg/dL — ABNORMAL HIGH (ref 70–99)

## 2021-09-28 MED ORDER — FLUDEOXYGLUCOSE F - 18 (FDG) INJECTION
12.6000 | Freq: Once | INTRAVENOUS | Status: AC
Start: 2021-09-28 — End: 2021-09-28
  Administered 2021-09-28: 12.38 via INTRAVENOUS

## 2021-10-05 ENCOUNTER — Encounter: Payer: Self-pay | Admitting: Pulmonary Disease

## 2021-10-05 ENCOUNTER — Ambulatory Visit: Payer: Medicare Other | Admitting: Pulmonary Disease

## 2021-10-05 VITALS — BP 122/80 | HR 75 | Temp 98.2°F | Ht 61.0 in | Wt 248.6 lb

## 2021-10-05 DIAGNOSIS — Z87891 Personal history of nicotine dependence: Secondary | ICD-10-CM | POA: Diagnosis not present

## 2021-10-05 DIAGNOSIS — Z720 Tobacco use: Secondary | ICD-10-CM | POA: Diagnosis not present

## 2021-10-05 DIAGNOSIS — R911 Solitary pulmonary nodule: Secondary | ICD-10-CM | POA: Diagnosis not present

## 2021-10-05 DIAGNOSIS — R948 Abnormal results of function studies of other organs and systems: Secondary | ICD-10-CM | POA: Diagnosis not present

## 2021-10-05 NOTE — Patient Instructions (Signed)
Thank you for visiting Dr. Valeta Harms at [facility]. Today we recommend the following:  Appt with Dr. Kipp Brood at [facility] on Friday 1030AM   Return in about 3 months (around 01/05/2022) for Dr. Valeta Harms.    Please do your part to reduce the spread of COVID-19.

## 2021-10-05 NOTE — Progress Notes (Signed)
EN  Synopsis: Referred in May 2023 for lung nodule by Aura Dials, MD  Subjective:   PATIENT ID: Kathryn [name] GENDER: female DOB: October 20, 1950, MRN: [id number]  Chief Complaint  Patient presents with   Follow-up    Follow up.     This is a 71 year old female, past medical history of diabetes, hypertension, gastroesophageal reflux.  Patient had a lung cancer screening CT completed on 09/01/2021 which revealed a left upper lobe 30 mm pulmonary nodule concerning for malignancy.  Patient was referred here for evaluation and to discuss next steps.  She quit smoking 4 years ago.  She does however have a longstanding history of tobacco use.  She has not had any diagnosis of cancers in the past 5 years.  She does occasionally have a mildly productive cough.  She has not had any PFTs.  Never been told she had COPD.  Her cardiologist also has her planned for a stress test.  OV 10/05/2021: Here today for follow-up after Her recent nuclear medicine pet imaging.  Nuclear medicine pet imaging was completed on 09/28/2021 which revealed hypermetabolic uptake within the 2.5 cm left upper lobe pulmonary nodule consistent with a primary bronchogenic carcinoma. also there was asymmetric hypermetabolic nodular thickening of the right glossotonsillar sulcus recommending direct visualization.  There was no evidence of distant metastatic disease within the head neck Or abdomen pelvis.  From a respiratory standpoint she is breathing fine.  We reviewed her nuclear medicine pet imaging today in the office and discussed neck steps to include either biopsy and radiation or considerations for surgery.     Past Medical History:  Diagnosis Date   Depression    Diabetes ([place])    GERD (gastroesophageal reflux disease)    HTN (hypertension)    Hyperglycemia      Family History  Problem Relation Age of Onset   Heart failure Mother    Coronary artery disease Sister 60     Past Surgical History:  Procedure Laterality  Date   CHOLECYSTECTOMY     ELBOW SURGERY      Social History   Socioeconomic History   Marital status: Married    Spouse name: Not on file   Number of children: Not on file   Years of education: Not on file   Highest education level: Not on file  Occupational History   Not on file  Tobacco Use   Smoking status: Former    Types: Cigarettes   Smokeless tobacco: Never  Substance and Sexual Activity   Alcohol use: Not on file   Drug use: Not on file   Sexual activity: Not on file  Other Topics Concern   Not on file  Social History Narrative   Lives with her husband.  Together they have six children.   Social Determinants of Health   Financial Resource Strain: Not on file  Food Insecurity: Not on file  Transportation Needs: Not on file  Physical Activity: Not on file  Stress: Not on file  Social Connections: Not on file  Intimate Partner Violence: Not on file     Not on File   Outpatient Medications Prior to Visit  Medication Sig Dispense Refill   ALPRAZolam (XANAX) 0.25 MG tablet as needed.     aspirin 81 MG chewable tablet 1 tablet     atorvastatin (LIPITOR) 10 MG tablet Take 1 tablet by mouth daily.     Cholecalciferol (VITAMIN D3) 10 MCG (400 UNIT) CAPS      diazepam (VALIUM)  5 MG tablet Take 1 tablet (5 mg total) by mouth as needed for anxiety (prior to imaging). 2 tablet 0   Fish Oil-Krill Oil (KRILL & FISH OIL BLEND PO)      fluticasone (FLONASE) 50 MCG/ACT nasal spray As needed.     Magnesium 250 MG TABS Take by mouth.     metFORMIN (GLUCOPHAGE) 500 MG tablet TAKE 1 TABLET BY MOUTH ONCE DAILY PRIOR TO EVENING MEAL     Multiple Vitamins-Minerals (MULTIVITAMIN ADULTS 50+ PO)      triamcinolone cream (KENALOG) 0.5 % 1 application     triamterene-hydrochlorothiazide (DYAZIDE) 37.5-25 MG capsule TAKE 1 CAPSULE BY MOUTH IN  THE MORNING ONCE DAILY     verapamil (CALAN-SR) 120 MG CR tablet Take 1 tablet by mouth 2 (two) times daily.     No facility-administered  medications prior to visit.    Review of Systems  Constitutional:  Negative for chills, fever, malaise/fatigue and weight loss.  HENT:  Negative for hearing loss, sore throat and tinnitus.   Eyes:  Negative for blurred vision and double vision.  Respiratory:  Positive for cough and shortness of breath. Negative for hemoptysis, sputum production, wheezing and stridor.   Cardiovascular:  Negative for chest pain, palpitations, orthopnea, leg swelling and PND.  Gastrointestinal:  Negative for abdominal pain, constipation, diarrhea, heartburn, nausea and vomiting.  Genitourinary:  Negative for dysuria, hematuria and urgency.  Musculoskeletal:  Negative for joint pain and myalgias.  Skin:  Negative for itching and rash.  Neurological:  Negative for dizziness, tingling, weakness and headaches.  Endo/Heme/Allergies:  Negative for environmental allergies. Does not bruise/bleed easily.  Psychiatric/Behavioral:  Negative for depression. The patient is not nervous/anxious and does not have insomnia.   All other systems reviewed and are negative.   Objective:  Physical Exam Vitals reviewed.  Constitutional:      General: She is not in acute distress.    Appearance: She is well-developed. She is obese.  HENT:     Head: Normocephalic and atraumatic.  Eyes:     General: No scleral icterus.    Conjunctiva/sclera: Conjunctivae normal.     Pupils: Pupils are equal, round, and reactive to light.  Neck:     Vascular: No JVD.     Trachea: No tracheal deviation.  Cardiovascular:     Rate and Rhythm: Normal rate and regular rhythm.     Heart sounds: Normal heart sounds. No murmur heard. Pulmonary:     Effort: Pulmonary effort is normal. No tachypnea, accessory muscle usage or respiratory distress.     Breath sounds: No stridor. No wheezing, rhonchi or rales.     Comments: No crackles no wheeze Abdominal:     Palpations: Abdomen is soft.     Tenderness: There is no abdominal tenderness.   Musculoskeletal:        General: No tenderness.     Cervical back: Neck supple.  Lymphadenopathy:     Cervical: No cervical adenopathy.  Skin:    General: Skin is warm and dry.     Capillary Refill: Capillary refill takes less than 2 seconds.     Findings: No rash.  Neurological:     Mental Status: She is alert and oriented to person, place, and time.  Psychiatric:        Behavior: Behavior normal.     Vitals:   10/05/21 1340  BP: 122/80  Pulse: 75  Temp: 98.2 F (36.8 C)  TempSrc: Oral  SpO2: 93%  Weight: 248 lb  9.6 oz (112.8 kg)  Height: 5\' 1"  (1.549 m)   93% on RA BMI Readings from Last 3 Encounters:  10/05/21 46.97 kg/m  09/14/21 47.16 kg/m  09/07/21 45.40 kg/m   Wt Readings from Last 3 Encounters:  10/05/21 248 lb 9.6 oz (112.8 kg)  09/14/21 249 lb 9.6 oz (113.2 kg)  09/07/21 246 lb 3.2 oz (111.7 kg)     CBC No results found for: WBC, RBC, HGB, HCT, PLT, MCV, MCH, MCHC, RDW, LYMPHSABS, MONOABS, EOSABS, BASOSABS   Chest Imaging: April 2023 lung cancer screening CT: 30 mm left upper lobe pulmonary nodule Report reviewed in epic care everywhere completed at [facility].  Nuclear medicine pet imaging May 9485: Hypermetabolic left upper lobe pulmonary nodule concerning for primary bronchogenic carcinoma. Also hypermetabolic uptake within the posterior pharynx. The patient's images have been independently reviewed by me.    Pulmonary Functions Testing Results:    Latest Ref Rng & Units 09/17/2021    9:29 AM  PFT Results  FVC-Pre L 2.05    FVC-Predicted Pre % 77    FVC-Post L 2.05    FVC-Predicted Post % 77    Pre FEV1/FVC % % 77    Post FEV1/FCV % % 74    FEV1-Pre L 1.58    FEV1-Predicted Pre % 79    FEV1-Post L 1.52    DLCO uncorrected ml/min/mmHg 18.12    DLCO UNC% % 103    DLCO corrected ml/min/mmHg 17.15    DLCO COR %Predicted % 97    DLVA Predicted % 110      FeNO:   Pathology:   Echocardiogram:   Heart Catheterization:      Assessment & Plan:     ICD-10-CM   1. Abnormal positron emission tomography (PET) scan  R94.8 Ambulatory referral to ENT    Ambulatory referral to Cardiothoracic Surgery    2. Left upper lobe pulmonary nodule  R91.1     3. Tobacco abuse  Z72.0     4. Former smoker  Z87.891        Discussion:  This is a 71 year old female, left upper lobe pulmonary nodule, history of tobacco use, former smoker quit 4 years ago.  Found to have a lung nodule on lung cancer screening CT and a nuclear medicine PET scan confirming hypermetabolic uptake within the lesion no evidence of thoracic adenopathy.  Also a faint uptake within the area posterior pharynx.  Plan: We discussed the pros and cons of biopsy versus direct resection. Patient would like to talk to thoracic surgery about having the lesion removed. Her pulmonary function test looked good. She had a recent stress test completed. There is no evidence of ischemia. She quit smoking 4 years ago. I do think that she would be a candidate for surgery hopefully. We will refer to ENT to look at this area in the posterior pharynx. We will send her to see Dr. Kipp Brood this Friday, appointment made for 10:30 AM.   Current Outpatient Medications:    ALPRAZolam (XANAX) 0.25 MG tablet, as needed., Disp: , Rfl:    aspirin 81 MG chewable tablet, 1 tablet, Disp: , Rfl:    atorvastatin (LIPITOR) 10 MG tablet, Take 1 tablet by mouth daily., Disp: , Rfl:    Cholecalciferol (VITAMIN D3) 10 MCG (400 UNIT) CAPS, , Disp: , Rfl:    diazepam (VALIUM) 5 MG tablet, Take 1 tablet (5 mg total) by mouth as needed for anxiety (prior to imaging)., Disp: 2 tablet, Rfl: 0  Fish Oil-Krill Oil (KRILL & FISH OIL BLEND PO), , Disp: , Rfl:    fluticasone (FLONASE) 50 MCG/ACT nasal spray, As needed., Disp: , Rfl:    Magnesium 250 MG TABS, Take by mouth., Disp: , Rfl:    metFORMIN (GLUCOPHAGE) 500 MG tablet, TAKE 1 TABLET BY MOUTH ONCE DAILY PRIOR TO EVENING MEAL, Disp: , Rfl:     Multiple Vitamins-Minerals (MULTIVITAMIN ADULTS 50+ PO), , Disp: , Rfl:    triamcinolone cream (KENALOG) 0.5 %, 1 application, Disp: , Rfl:    triamterene-hydrochlorothiazide (DYAZIDE) 37.5-25 MG capsule, TAKE 1 CAPSULE BY MOUTH IN  THE MORNING ONCE DAILY, Disp: , Rfl:    verapamil (CALAN-SR) 120 MG CR tablet, Take 1 tablet by mouth 2 (two) times daily., Disp: , Rfl:   Garner Nash, DO [facility] 10/05/2021 2:11 PM

## 2021-10-08 NOTE — H&P (View-Only) (Signed)
[address]       [place] [address]             [id number]                    Kathryn [name] [place] Medical Record #[id number] Date of Birth: Jul 17, 1950  Referring: Garner Nash, DO Primary Care: Aura Dials, MD Primary Cardiologist: Minus Breeding, MD  Chief Complaint:    Chief Complaint  Patient presents with   Lung Lesion    Initial surgical consult, pet 5/15, pft 5/4, sniff 5/11    History of Present Illness:    Kathryn [name] 71 y.o. female referred by Dr. Valeta Harms for surgical evaluation of a 2.5 cm PET avid left upper lobe pulmonary nodule.  This was found on lung cancer screening.  She does have a long history of smoking but quit 40 years ago.  She also has a history of chronic cough.   Zubrod Score: At the time of surgery this patient's most appropriate activity status/level should be described as: []     0    Normal activity, no symptoms [x]     1    Restricted in physical strenuous activity but ambulatory, able to do out light work []     2    Ambulatory and capable of self care, unable to do work activities, up and about               >50 % of waking hours                              []     3    Only limited self care, in bed greater than 50% of waking hours []     4    Completely disabled, no self care, confined to bed or chair []     5    Moribund   Past Medical History:  Diagnosis Date   Depression    Diabetes ([place])    GERD (gastroesophageal reflux disease)    HTN (hypertension)    Hyperglycemia     Past Surgical History:  Procedure Laterality Date   CHOLECYSTECTOMY     ELBOW SURGERY      Family History  Problem Relation Age of Onset   Heart failure Mother    Coronary artery disease Sister 26     Social History   Tobacco Use  Smoking Status Former   Types: Cigarettes  Smokeless Tobacco Never    Social History   Substance and Sexual Activity  Alcohol Use None     Not on File  Current Outpatient  Medications  Medication Sig Dispense Refill   ALPRAZolam (XANAX) 0.25 MG tablet as needed.     aspirin 81 MG chewable tablet 1 tablet     atorvastatin (LIPITOR) 10 MG tablet Take 1 tablet by mouth daily.     Cholecalciferol (VITAMIN D3) 10 MCG (400 UNIT) CAPS      diazepam (VALIUM) 5 MG tablet Take 1 tablet (5 mg total) by mouth as needed for anxiety (prior to imaging). 2 tablet 0   Fish Oil-Krill Oil (KRILL & FISH OIL BLEND PO)      fluticasone (FLONASE) 50 MCG/ACT nasal spray As needed.     Magnesium 250 MG TABS Take by mouth.     metFORMIN (GLUCOPHAGE) 500 MG tablet TAKE 1 TABLET BY MOUTH ONCE DAILY PRIOR TO EVENING  MEAL     Multiple Vitamins-Minerals (MULTIVITAMIN ADULTS 50+ PO)      triamcinolone cream (KENALOG) 0.5 % 1 application     triamterene-hydrochlorothiazide (DYAZIDE) 37.5-25 MG capsule TAKE 1 CAPSULE BY MOUTH IN  THE MORNING ONCE DAILY     verapamil (CALAN-SR) 120 MG CR tablet Take 1 tablet by mouth 2 (two) times daily.     No current facility-administered medications for this visit.    Review of Systems  Constitutional:  Positive for malaise/fatigue. Negative for weight loss.  Respiratory:  Positive for cough and shortness of breath.   Cardiovascular:  Negative for chest pain.    PHYSICAL EXAMINATION: BP (!) 162/97 (BP Location: Left Arm, Patient Position: Sitting)   Pulse 83   Resp 20   Ht 5\' 1"  (1.549 m)   Wt 247 lb (112 kg)   SpO2 91% Comment: RA  BMI 46.67 kg/m  Physical Exam Constitutional:      General: She is not in acute distress.    Appearance: Normal appearance. She is obese. She is not ill-appearing.  Cardiovascular:     Rate and Rhythm: Normal rate.  Pulmonary:     Effort: Pulmonary effort is normal. No respiratory distress.  Abdominal:     General: There is no distension.  Musculoskeletal:        General: Normal range of motion.     Cervical back: Normal range of motion.  Skin:    General: Skin is warm and dry.  Neurological:     General:  No focal deficit present.     Mental Status: She is alert and oriented to person, place, and time.    Diagnostic Studies & Laboratory data:     Recent Radiology Findings:   NM PET Image Initial (PI) Skull Base To Thigh (F-18 FDG)  Result Date: 09/29/2021 CLINICAL DATA:  Initial treatment strategy for left pulmonary nodule. EXAM: NUCLEAR MEDICINE PET SKULL BASE TO THIGH TECHNIQUE: 12.38 mCi F-18 FDG was injected intravenously. Full-ring PET imaging was performed from the skull base to thigh after the radiotracer. CT data was obtained and used for attenuation correction and anatomic localization. Fasting blood glucose: 125 mg/dl COMPARISON:  None Available. FINDINGS: Mediastinal blood pool activity: SUV max 2.82 Liver activity: SUV max NA NECK: No hypermetabolic cervical adenopathy. Hypermetabolic thickening of the right glossotonsillar sulcus on image 23/4 with a max SUV of 5.88 Incidental CT findings: Streak artifact from dental hardware. CHEST: Hypermetabolic left upper lobe pulmonary nodule measures 2.5 x 2.2 cm on image 19/7 with a max SUV of 18.93. No hypermetabolic thoracic adenopathy. Incidental CT findings: Aortic atherosclerosis. Coronary artery calcifications. ABDOMEN/PELVIS: No abnormal hypermetabolic activity within the liver, pancreas, adrenal glands, or spleen. No hypermetabolic lymph nodes in the abdomen or pelvis. Diffuse hypermetabolic colonic uptake commonly physiologic and can be seen in the setting of such medications as metformin. Incidental CT findings: Gallbladder surgically absent. Extensive left-sided predominant colonic diverticulosis. Hypodense simple appearing 2.8 cm left adnexal cyst is not demonstrate abnormal FDG avidity requires no independent follow-up based on size and imaging characteristics. Aortic and branch vessel atherosclerosis without abdominal aortic aneurysm. SKELETON: No focal hypermetabolic activity to suggest skeletal metastasis. Incidental CT findings: none  IMPRESSION: 1. Hypermetabolic 2.5 cm left upper lobe pulmonary nodule is most consistent with a primary bronchogenic neoplasm. 2. No hypermetabolic thoracic lymph nodes or evidence of hypermetabolic distant metastatic disease in the neck, chest, abdomen or pelvis. 3. Asymmetric hypermetabolic nodular thickening of the right glossotonsillar sulcus is nonspecific, recommend correlation  with direct visualization. Electronically Signed   By: Dahlia Bailiff M.D.   On: 09/29/2021 13:36   EXERCISE TOLERANCE TEST (ETT)  Result Date: 09/24/2021   Exercise stress test:  Clinically and electrically negative for ischemia   Poor exercise tolerance   Poorly trained response.       I have independently reviewed the above radiology studies  and reviewed the findings with the patient.   Recent Lab Findings: No results found for: WBC, HGB, HCT, PLT, GLUCOSE, CHOL, TRIG, HDL, LDLDIRECT, LDLCALC, ALT, AST, NA, K, CL, CREATININE, BUN, CO2, TSH, INR, GLUF, HGBA1C   PFTs:  - FVC: 77% - FEV1: 79% -DLCO: 97%     Assessment / Plan:   71 yo female with 2.5 cm left upper lobe pulmonary nodule concerning for primary lung cancer.  Will plan for L RATS, LULectomy.  The risks and benefits have been discussed and she is agreeable proceeding.     I  spent 60 minutes with  the patient face to face in counseling and coordination of care.    [name] 10/09/2021 4:08 PM

## 2021-10-08 NOTE — Progress Notes (Signed)
[name]       [name] [address]             [phone]                    Kyri L Boyden [place] Medical Record #[id number] Date of Birth: 1950/12/09  Referring: Garner Nash, DO Primary Care: Aura Dials, MD Primary Cardiologist: Minus Breeding, MD  Chief Complaint:    Chief Complaint  Patient presents with   Lung Lesion    Initial surgical consult, pet 5/15, pft 5/4, sniff 5/11    History of Present Illness:    Kathryn [name] 71 y.o. female referred by Dr. Valeta Harms for surgical evaluation of a 2.5 cm PET avid left upper lobe pulmonary nodule.  This was found on lung cancer screening.  She does have a long history of smoking but quit 40 years ago.  She also has a history of chronic cough.   Zubrod Score: At the time of surgery this patient's most appropriate activity status/level should be described as: []     0    Normal activity, no symptoms [x]     1    Restricted in physical strenuous activity but ambulatory, able to do out light work []     2    Ambulatory and capable of self care, unable to do work activities, up and about               >50 % of waking hours                              []     3    Only limited self care, in bed greater than 50% of waking hours []     4    Completely disabled, no self care, confined to bed or chair []     5    Moribund   Past Medical History:  Diagnosis Date   Depression    Diabetes ([place])    GERD (gastroesophageal reflux disease)    HTN (hypertension)    Hyperglycemia     Past Surgical History:  Procedure Laterality Date   CHOLECYSTECTOMY     ELBOW SURGERY      Family History  Problem Relation Age of Onset   Heart failure Mother    Coronary artery disease Sister 86     Social History   Tobacco Use  Smoking Status Former   Types: Cigarettes  Smokeless Tobacco Never    Social History   Substance and Sexual Activity  Alcohol Use None     Not on File  Current Outpatient  Medications  Medication Sig Dispense Refill   ALPRAZolam (XANAX) 0.25 MG tablet as needed.     aspirin 81 MG chewable tablet 1 tablet     atorvastatin (LIPITOR) 10 MG tablet Take 1 tablet by mouth daily.     Cholecalciferol (VITAMIN D3) 10 MCG (400 UNIT) CAPS      diazepam (VALIUM) 5 MG tablet Take 1 tablet (5 mg total) by mouth as needed for anxiety (prior to imaging). 2 tablet 0   Fish Oil-Krill Oil (KRILL & FISH OIL BLEND PO)      fluticasone (FLONASE) 50 MCG/ACT nasal spray As needed.     Magnesium 250 MG TABS Take by mouth.     metFORMIN (GLUCOPHAGE) 500 MG tablet TAKE 1 TABLET BY MOUTH ONCE DAILY PRIOR TO EVENING  MEAL     Multiple Vitamins-Minerals (MULTIVITAMIN ADULTS 50+ PO)      triamcinolone cream (KENALOG) 0.5 % 1 application     triamterene-hydrochlorothiazide (DYAZIDE) 37.5-25 MG capsule TAKE 1 CAPSULE BY MOUTH IN  THE MORNING ONCE DAILY     verapamil (CALAN-SR) 120 MG CR tablet Take 1 tablet by mouth 2 (two) times daily.     No current facility-administered medications for this visit.    Review of Systems  Constitutional:  Positive for malaise/fatigue. Negative for weight loss.  Respiratory:  Positive for cough and shortness of breath.   Cardiovascular:  Negative for chest pain.    PHYSICAL EXAMINATION: BP (!) 162/97 (BP Location: Left Arm, Patient Position: Sitting)   Pulse 83   Resp 20   Ht 5\' 1"  (1.549 m)   Wt 247 lb (112 kg)   SpO2 91% Comment: RA  BMI 46.67 kg/m  Physical Exam Constitutional:      General: She is not in acute distress.    Appearance: Normal appearance. She is obese. She is not ill-appearing.  Cardiovascular:     Rate and Rhythm: Normal rate.  Pulmonary:     Effort: Pulmonary effort is normal. No respiratory distress.  Abdominal:     General: There is no distension.  Musculoskeletal:        General: Normal range of motion.     Cervical back: Normal range of motion.  Skin:    General: Skin is warm and dry.  Neurological:     General:  No focal deficit present.     Mental Status: She is alert and oriented to person, place, and time.    Diagnostic Studies & Laboratory data:     Recent Radiology Findings:   NM PET Image Initial (PI) Skull Base To Thigh (F-18 FDG)  Result Date: 09/29/2021 CLINICAL DATA:  Initial treatment strategy for left pulmonary nodule. EXAM: NUCLEAR MEDICINE PET SKULL BASE TO THIGH TECHNIQUE: 12.38 mCi F-18 FDG was injected intravenously. Full-ring PET imaging was performed from the skull base to thigh after the radiotracer. CT data was obtained and used for attenuation correction and anatomic localization. Fasting blood glucose: 125 mg/dl COMPARISON:  None Available. FINDINGS: Mediastinal blood pool activity: SUV max 2.82 Liver activity: SUV max NA NECK: No hypermetabolic cervical adenopathy. Hypermetabolic thickening of the right glossotonsillar sulcus on image 23/4 with a max SUV of 5.88 Incidental CT findings: Streak artifact from dental hardware. CHEST: Hypermetabolic left upper lobe pulmonary nodule measures 2.5 x 2.2 cm on image 19/7 with a max SUV of 18.93. No hypermetabolic thoracic adenopathy. Incidental CT findings: Aortic atherosclerosis. Coronary artery calcifications. ABDOMEN/PELVIS: No abnormal hypermetabolic activity within the liver, pancreas, adrenal glands, or spleen. No hypermetabolic lymph nodes in the abdomen or pelvis. Diffuse hypermetabolic colonic uptake commonly physiologic and can be seen in the setting of such medications as metformin. Incidental CT findings: Gallbladder surgically absent. Extensive left-sided predominant colonic diverticulosis. Hypodense simple appearing 2.8 cm left adnexal cyst is not demonstrate abnormal FDG avidity requires no independent follow-up based on size and imaging characteristics. Aortic and branch vessel atherosclerosis without abdominal aortic aneurysm. SKELETON: No focal hypermetabolic activity to suggest skeletal metastasis. Incidental CT findings: none  IMPRESSION: 1. Hypermetabolic 2.5 cm left upper lobe pulmonary nodule is most consistent with a primary bronchogenic neoplasm. 2. No hypermetabolic thoracic lymph nodes or evidence of hypermetabolic distant metastatic disease in the neck, chest, abdomen or pelvis. 3. Asymmetric hypermetabolic nodular thickening of the right glossotonsillar sulcus is nonspecific, recommend correlation  with direct visualization. Electronically Signed   By: Dahlia Bailiff M.D.   On: 09/29/2021 13:36   EXERCISE TOLERANCE TEST (ETT)  Result Date: 09/24/2021   Exercise stress test:  Clinically and electrically negative for ischemia   Poor exercise tolerance   Poorly trained response.       I have independently reviewed the above radiology studies  and reviewed the findings with the patient.   Recent Lab Findings: No results found for: WBC, HGB, HCT, PLT, GLUCOSE, CHOL, TRIG, HDL, LDLDIRECT, LDLCALC, ALT, AST, NA, K, CL, CREATININE, BUN, CO2, TSH, INR, GLUF, HGBA1C   PFTs:  - FVC: 77% - FEV1: 79% -DLCO: 97%     Assessment / Plan:   71 yo female with 2.5 cm left upper lobe pulmonary nodule concerning for primary lung cancer.  Will plan for L RATS, LULectomy.  The risks and benefits have been discussed and she is agreeable proceeding.     I  spent 60 minutes with  the patient face to face in counseling and coordination of care.    [name] 10/09/2021 4:08 PM

## 2021-10-09 ENCOUNTER — Encounter: Payer: Self-pay | Admitting: *Deleted

## 2021-10-09 ENCOUNTER — Other Ambulatory Visit: Payer: Self-pay | Admitting: *Deleted

## 2021-10-09 ENCOUNTER — Ambulatory Visit: Payer: Medicare Other | Admitting: Thoracic Surgery (Cardiothoracic Vascular Surgery)

## 2021-10-09 VITALS — BP 162/97 | HR 83 | Resp 20 | Ht 61.0 in | Wt 247.0 lb

## 2021-10-09 DIAGNOSIS — R911 Solitary pulmonary nodule: Secondary | ICD-10-CM | POA: Diagnosis not present

## 2021-10-16 NOTE — Pre-Procedure Instructions (Signed)
Surgical Instructions    Your procedure is scheduled on Wednesday 10/21/21.   Report to [facility] Main Entrance "A" at 06:25 A.M., then check in with the Admitting office.  Call this number if you have problems the morning of surgery:  [phone]   If you have any questions prior to your surgery date call [phone]: Open Monday-Friday 8am-4pm    Remember:  Do not eat or drink after midnight the night before your surgery    Take these medicines the morning of surgery with A SIP OF WATER:   atorvastatin (LIPITOR)  verapamil (CALAN-SR)   Take these medicines if needed:   ALPRAZolam Duanne Moron)  fluticasone (FLONASE)   As of today, STOP taking any Aspirin (unless otherwise instructed by your surgeon) Aleve, Naproxen, Ibuprofen, Motrin, Advil, Goody's, BC's, all herbal medications, fish oil, and all vitamins.  WHAT DO I DO ABOUT MY DIABETES MEDICATION?   Do not take oral diabetes medicines (pills) the morning of surgery.  DO NOT TAKE metFORMIN (GLUCOPHAGE) the day of surgery.   The day of surgery, do not take other diabetes injectables, including Byetta (exenatide), Bydureon (exenatide ER), Victoza (liraglutide), or Trulicity (dulaglutide).  HOW TO MANAGE YOUR DIABETES BEFORE AND AFTER SURGERY  Why is it important to control my blood sugar before and after surgery? Improving blood sugar levels before and after surgery helps healing and can limit problems. A way of improving blood sugar control is eating a healthy diet by:  Eating less sugar and carbohydrates  Increasing activity/exercise  Talking with your doctor about reaching your blood sugar goals High blood sugars (greater than 180 mg/dL) can raise your risk of infections and slow your recovery, so you will need to focus on controlling your diabetes during the weeks before surgery. Make sure that the doctor who takes care of your diabetes knows about your planned surgery including the date and location.  How do I manage  my blood sugar before surgery? Check your blood sugar at least 4 times a day, starting 2 days before surgery, to make sure that the level is not too high or low.  Check your blood sugar the morning of your surgery when you wake up and every 2 hours until you get to the Short Stay unit.  If your blood sugar is less than 70 mg/dL, you will need to treat for low blood sugar: Do not take insulin. Treat a low blood sugar (less than 70 mg/dL) with  cup of clear juice (cranberry or apple), 4 glucose tablets, OR glucose gel. Recheck blood sugar in 15 minutes after treatment (to make sure it is greater than 70 mg/dL). If your blood sugar is not greater than 70 mg/dL on recheck, call [phone] for further instructions. Report your blood sugar to the short stay nurse when you get to Short Stay.  If you are admitted to the hospital after surgery: Your blood sugar will be checked by the staff and you will probably be given insulin after surgery (instead of oral diabetes medicines) to make sure you have good blood sugar levels. The goal for blood sugar control after surgery is 80-180 mg/dL.           Do not wear jewelry or makeup Do not wear lotions, powders, perfumes/colognes, or deodorant. Do not shave 48 hours prior to surgery.  Men may shave face and neck. Do not bring valuables to the hospital. Do not wear nail polish, gel polish, artificial nails, or any other type of covering on natural  nails (fingers and toes) If you have artificial nails or gel coating that need to be removed by a nail salon, please have this removed prior to surgery. Artificial nails or gel coating may interfere with anesthesia's ability to adequately monitor your vital signs.  [place] [facility] is not responsible for any belongings or valuables. .   Do NOT Smoke (Tobacco/Vaping)  24 hours prior to your procedure  If you use a CPAP at night, you may bring your mask for your overnight stay.   Contacts, glasses, hearing aids,  dentures or partials may not be worn into surgery, please bring cases for these belongings   For patients admitted to the hospital, discharge time will be determined by your treatment team.   Patients discharged the day of surgery will not be allowed to drive home, and someone needs to stay with them for 24 hours.   SURGICAL WAITING ROOM VISITATION Patients having surgery or a procedure in a hospital may have two support people. Children under the age of 60 must have an adult with them who is not the patient. They may stay in the waiting area during the procedure and may switch out with other visitors. If the patient needs to stay at the hospital during part of their recovery, the visitor guidelines for inpatient rooms apply.  Please refer to the [facility] website for the visitor guidelines for Inpatients (after your surgery is over and you are in a regular room).       Special instructions:    Oral Hygiene is also important to reduce your risk of infection.  Remember - BRUSH YOUR TEETH THE MORNING OF SURGERY WITH YOUR REGULAR TOOTHPASTE   [facility]- Preparing For Surgery  Before surgery, you can play an important role. Because skin is not sterile, your skin needs to be as free of germs as possible. You can reduce the number of germs on your skin by washing with CHG (chlorahexidine gluconate) Soap before surgery.  CHG is an antiseptic cleaner which kills germs and bonds with the skin to continue killing germs even after washing.     Please do not use if you have an allergy to CHG or antibacterial soaps. If your skin becomes reddened/irritated stop using the CHG.  Do not shave (including legs and underarms) for at least 48 hours prior to first CHG shower. It is OK to shave your face.  Please follow these instructions carefully.     Shower the NIGHT BEFORE SURGERY and the MORNING OF SURGERY with CHG Soap.   If you chose to wash your hair, wash your hair first as usual with your  normal shampoo. After you shampoo, rinse your hair and body thoroughly to remove the shampoo.  Then [employer] and genitals (private parts) with your normal soap and rinse thoroughly to remove soap.  After that Use CHG Soap as you would any other liquid soap. You can apply CHG directly to the skin and wash gently with a scrungie or a clean washcloth.   Apply the CHG Soap to your body ONLY FROM THE NECK DOWN.  Do not use on open wounds or open sores. Avoid contact with your eyes, ears, mouth and genitals (private parts). Wash Face and genitals (private parts)  with your normal soap.   Wash thoroughly, paying special attention to the area where your surgery will be performed.  Thoroughly rinse your body with warm water from the neck down.  DO NOT shower/wash with your normal soap after using and  rinsing off the CHG Soap.  Pat yourself dry with a CLEAN TOWEL.  Wear CLEAN PAJAMAS to bed the night before surgery  Place CLEAN SHEETS on your bed the night before your surgery  DO NOT SLEEP WITH PETS.   Day of Surgery:  Take a shower with CHG soap. Wear Clean/Comfortable clothing the morning of surgery Do not apply any deodorants/lotions.   Remember to brush your teeth WITH YOUR REGULAR TOOTHPASTE.    If you received a COVID test during your pre-op visit, it is requested that you wear a mask when out in public, stay away from anyone that may not be feeling well, and notify your surgeon if you develop symptoms. If you have been in contact with anyone that has tested positive in the last 10 days, please notify your surgeon.    Please read over the following fact sheets that you were given.

## 2021-10-19 ENCOUNTER — Encounter (HOSPITAL_COMMUNITY)
Admission: RE | Admit: 2021-10-19 | Discharge: 2021-10-19 | Disposition: A | Discharge status: Home / Self Care | Payer: Medicare Other | Source: Ambulatory Visit | Attending: Thoracic Surgery (Cardiothoracic Vascular Surgery) | Admitting: Thoracic Surgery (Cardiothoracic Vascular Surgery)

## 2021-10-19 ENCOUNTER — Encounter (HOSPITAL_COMMUNITY): Payer: Self-pay

## 2021-10-19 ENCOUNTER — Other Ambulatory Visit: Payer: Self-pay

## 2021-10-19 ENCOUNTER — Ambulatory Visit (HOSPITAL_COMMUNITY)
Admission: RE | Admit: 2021-10-19 | Discharge: 2021-10-19 | Disposition: A | Discharge status: Home / Self Care | Payer: Medicare Other | Source: Ambulatory Visit | Attending: Thoracic Surgery (Cardiothoracic Vascular Surgery) | Admitting: Thoracic Surgery (Cardiothoracic Vascular Surgery)

## 2021-10-19 VITALS — BP 167/78 | HR 85 | Temp 97.9°F | Resp 18 | Ht 61.0 in | Wt 247.6 lb

## 2021-10-19 DIAGNOSIS — E119 Type 2 diabetes mellitus without complications: Secondary | ICD-10-CM | POA: Insufficient documentation

## 2021-10-19 DIAGNOSIS — Z79899 Other long term (current) drug therapy: Secondary | ICD-10-CM | POA: Diagnosis not present

## 2021-10-19 DIAGNOSIS — Z9981 Dependence on supplemental oxygen: Secondary | ICD-10-CM | POA: Diagnosis not present

## 2021-10-19 DIAGNOSIS — Z5309 Procedure and treatment not carried out because of other contraindication: Secondary | ICD-10-CM | POA: Diagnosis not present

## 2021-10-19 DIAGNOSIS — Z01818 Encounter for other preprocedural examination: Secondary | ICD-10-CM

## 2021-10-19 DIAGNOSIS — Z20822 Contact with and (suspected) exposure to covid-19: Secondary | ICD-10-CM | POA: Insufficient documentation

## 2021-10-19 DIAGNOSIS — R911 Solitary pulmonary nodule: Secondary | ICD-10-CM

## 2021-10-19 DIAGNOSIS — Z7984 Long term (current) use of oral hypoglycemic drugs: Secondary | ICD-10-CM | POA: Diagnosis not present

## 2021-10-19 DIAGNOSIS — Z6841 Body Mass Index (BMI) 40.0 and over, adult: Secondary | ICD-10-CM | POA: Diagnosis not present

## 2021-10-19 DIAGNOSIS — K219 Gastro-esophageal reflux disease without esophagitis: Secondary | ICD-10-CM | POA: Diagnosis not present

## 2021-10-19 DIAGNOSIS — Z8249 Family history of ischemic heart disease and other diseases of the circulatory system: Secondary | ICD-10-CM | POA: Diagnosis not present

## 2021-10-19 DIAGNOSIS — I10 Essential (primary) hypertension: Secondary | ICD-10-CM | POA: Diagnosis not present

## 2021-10-19 DIAGNOSIS — Z87891 Personal history of nicotine dependence: Secondary | ICD-10-CM | POA: Diagnosis not present

## 2021-10-19 DIAGNOSIS — Z7982 Long term (current) use of aspirin: Secondary | ICD-10-CM | POA: Diagnosis not present

## 2021-10-19 DIAGNOSIS — C3412 Malignant neoplasm of upper lobe, left bronchus or lung: Secondary | ICD-10-CM | POA: Diagnosis not present

## 2021-10-19 HISTORY — DX: Anxiety disorder, unspecified: F41.9

## 2021-10-19 HISTORY — DX: Dyspnea, unspecified: R06.00

## 2021-10-19 LAB — BLOOD GAS, ARTERIAL
Acid-Base Excess: 9.9 mmol/L — ABNORMAL HIGH (ref 0.0–2.0)
Bicarbonate: 34.3 mmol/L — ABNORMAL HIGH (ref 20.0–28.0)
Drawn by: 58793
O2 Saturation: 93 %
Patient temperature: 37
pCO2 arterial: 44 mmHg (ref 32–48)
pH, Arterial: 7.5 — ABNORMAL HIGH (ref 7.35–7.45)
pO2, Arterial: 60 mmHg — ABNORMAL LOW (ref 83–108)

## 2021-10-19 LAB — URINALYSIS, ROUTINE W REFLEX MICROSCOPIC
Bilirubin Urine: NEGATIVE
Glucose, UA: NEGATIVE mg/dL
Hgb urine dipstick: NEGATIVE
Ketones, ur: NEGATIVE mg/dL
Leukocytes,Ua: NEGATIVE
Nitrite: NEGATIVE
Protein, ur: NEGATIVE mg/dL
Specific Gravity, Urine: 1.017 (ref 1.005–1.030)
pH: 7 (ref 5.0–8.0)

## 2021-10-19 LAB — PROTIME-INR
INR: 0.9 (ref 0.8–1.2)
Prothrombin Time: 11.8 seconds (ref 11.4–15.2)

## 2021-10-19 LAB — COMPREHENSIVE METABOLIC PANEL
ALT: 40 U/L (ref 0–44)
AST: 36 U/L (ref 15–41)
Albumin: 4.1 g/dL (ref 3.5–5.0)
Alkaline Phosphatase: 43 U/L (ref 38–126)
Anion gap: 12 (ref 5–15)
BUN: 17 mg/dL (ref 8–23)
CO2: 26 mmol/L (ref 22–32)
Calcium: 10.2 mg/dL (ref 8.9–10.3)
Chloride: 103 mmol/L (ref 98–111)
Creatinine, Ser: 1.04 mg/dL — ABNORMAL HIGH (ref 0.44–1.00)
GFR, Estimated: 58 mL/min — ABNORMAL LOW (ref >60)
Glucose, Bld: 137 mg/dL — ABNORMAL HIGH (ref 70–99)
Potassium: 3.9 mmol/L (ref 3.5–5.1)
Sodium: 141 mmol/L (ref 135–145)
Total Bilirubin: 0.8 mg/dL (ref 0.3–1.2)
Total Protein: 7 g/dL (ref 6.5–8.1)

## 2021-10-19 LAB — TYPE AND SCREEN
ABO/RH(D): O POS
Antibody Screen: NEGATIVE

## 2021-10-19 LAB — SARS CORONAVIRUS 2 (TAT 6-24 HRS): SARS Coronavirus 2: NEGATIVE

## 2021-10-19 LAB — APTT: aPTT: 26 seconds (ref 24–36)

## 2021-10-19 LAB — HEMOGLOBIN A1C
Hgb A1c MFr Bld: 6 % — ABNORMAL HIGH (ref 4.8–5.6)
Mean Plasma Glucose: 125.5 mg/dL

## 2021-10-19 LAB — CBC
HCT: 48 % — ABNORMAL HIGH (ref 36.0–46.0)
Hemoglobin: 15.9 g/dL — ABNORMAL HIGH (ref 12.0–15.0)
MCH: 31.2 pg (ref 26.0–34.0)
MCHC: 33.1 g/dL (ref 30.0–36.0)
MCV: 94.3 fL (ref 80.0–100.0)
Platelets: 234 10*3/uL (ref 150–400)
RBC: 5.09 MIL/uL (ref 3.87–5.11)
RDW: 12.9 % (ref 11.5–15.5)
WBC: 8.1 10*3/uL (ref 4.0–10.5)
nRBC: 0 % (ref 0.0–0.2)

## 2021-10-19 LAB — GLUCOSE, CAPILLARY: Glucose-Capillary: 161 mg/dL — ABNORMAL HIGH (ref 70–99)

## 2021-10-19 LAB — SURGICAL PCR SCREEN

## 2021-10-19 NOTE — Progress Notes (Addendum)
Levonne Spiller, RN with Dr. Abran Duke office notified of abnormal ABG. Sigmund Hazel, PA with anesthesia also made aware.

## 2021-10-19 NOTE — Progress Notes (Signed)
PCP - Dr. Aura Dials Cardiologist - Dr. Minus Breeding  PPM/ICD - n/a Device Orders - n/a Rep Notified - n/a  Chest x-ray - 10/19/21 EKG - 10/19/21 Stress Test - 09/24/21 ECHO - n/a Cardiac Cath - n/a  Sleep Study - denies CPAP - n/a  CBG today- 161 Had sausage, egg, and cheese biscuit and tea with splenda for breakfast Checks Blood Sugar _____ times a day- patient does not check blood sugar at home  Blood Thinner Instructions: n/a Aspirin Instructions: As of today stop taking Aspirin unless otherwise instructed by your surgeon.   ERAS Protcol - NPO   COVID TEST- 10/19/21. Pending.    Anesthesia review: Yes  Patient denies shortness of breath, fever, cough and chest pain at PAT appointment   All instructions explained to the patient, with a verbal understanding of the material. Patient agrees to go over the instructions while at home for a better understanding. Patient also instructed to self quarantine after being tested for COVID-19. The opportunity to ask questions was provided.

## 2021-10-19 NOTE — Progress Notes (Signed)
Per the lab, PCR produced an invalid result and will need to be redrawn day of surgery.

## 2021-10-20 NOTE — Anesthesia Preprocedure Evaluation (Signed)
Anesthesia Evaluation  Patient identified by MRN, date of birth, ID band Patient awake    Reviewed: Allergy & Precautions, H&P , NPO status , Patient's Chart, lab work & pertinent test results  Airway Mallampati: II  TM Distance: >3 FB Neck ROM: Full    Dental no notable dental hx. (+) Edentulous Upper, Edentulous Lower, Dental Advisory Given   Pulmonary neg pulmonary ROS, former smoker,    Pulmonary exam normal breath sounds clear to auscultation       Cardiovascular Exercise Tolerance: Good hypertension, Pt. on medications  Rhythm:Regular Rate:Normal     Neuro/Psych Anxiety Depression negative neurological ROS     GI/Hepatic negative GI ROS, Neg liver ROS, GERD  ,  Endo/Other  diabetes, Type 2, Oral Hypoglycemic AgentsMorbid obesity  Renal/GU negative Renal ROS  negative genitourinary   Musculoskeletal   Abdominal   Peds  Hematology negative hematology ROS (+)   Anesthesia Other Findings   Reproductive/Obstetrics negative OB ROS                            Anesthesia Physical Anesthesia Plan  ASA: 3  Anesthesia Plan: General   Post-op Pain Management: Tylenol PO (pre-op)*   Induction: Intravenous  PONV Risk Score and Plan: 4 or greater and Ondansetron, Dexamethasone and Treatment may vary due to age or medical condition  Airway Management Planned: Double Lumen EBT  Additional Equipment: ClearSight, CVP and Ultrasound Guidance Line Placement  Intra-op Plan:   Post-operative Plan: Extubation in OR  Informed Consent: I have reviewed the patients History and Physical, chart, labs and discussed the procedure including the risks, benefits and alternatives for the proposed anesthesia with the patient or authorized representative who has indicated his/her understanding and acceptance.     Dental advisory given  Plan Discussed with: CRNA  Anesthesia Plan Comments:         Anesthesia Quick Evaluation

## 2021-10-21 ENCOUNTER — Other Ambulatory Visit: Payer: Self-pay

## 2021-10-21 ENCOUNTER — Inpatient Hospital Stay (HOSPITAL_COMMUNITY): Payer: Medicare Other

## 2021-10-21 ENCOUNTER — Inpatient Hospital Stay (HOSPITAL_COMMUNITY)
Admission: RE | Admit: 2021-10-21 | Discharge: 2021-10-24 | DRG: 164 | Disposition: A | Discharge status: Home / Self Care | Payer: Medicare Other | Attending: Thoracic Surgery (Cardiothoracic Vascular Surgery) | Admitting: Thoracic Surgery (Cardiothoracic Vascular Surgery)

## 2021-10-21 ENCOUNTER — Inpatient Hospital Stay (HOSPITAL_COMMUNITY): Payer: Medicare Other | Admitting: Physician Assistant

## 2021-10-21 ENCOUNTER — Encounter (HOSPITAL_COMMUNITY): Payer: Self-pay | Admitting: Thoracic Surgery (Cardiothoracic Vascular Surgery)

## 2021-10-21 ENCOUNTER — Encounter (HOSPITAL_COMMUNITY)
Admission: RE | Disposition: A | Discharge status: Home / Self Care | Payer: Self-pay | Source: Home / Self Care | Attending: Thoracic Surgery (Cardiothoracic Vascular Surgery)

## 2021-10-21 DIAGNOSIS — Z7984 Long term (current) use of oral hypoglycemic drugs: Secondary | ICD-10-CM

## 2021-10-21 DIAGNOSIS — J9811 Atelectasis: Secondary | ICD-10-CM | POA: Diagnosis not present

## 2021-10-21 DIAGNOSIS — Z6841 Body Mass Index (BMI) 40.0 and over, adult: Secondary | ICD-10-CM | POA: Diagnosis not present

## 2021-10-21 DIAGNOSIS — Z7982 Long term (current) use of aspirin: Secondary | ICD-10-CM | POA: Diagnosis not present

## 2021-10-21 DIAGNOSIS — Z20822 Contact with and (suspected) exposure to covid-19: Secondary | ICD-10-CM | POA: Diagnosis present

## 2021-10-21 DIAGNOSIS — E119 Type 2 diabetes mellitus without complications: Secondary | ICD-10-CM | POA: Diagnosis present

## 2021-10-21 DIAGNOSIS — R911 Solitary pulmonary nodule: Secondary | ICD-10-CM

## 2021-10-21 DIAGNOSIS — C3411 Malignant neoplasm of upper lobe, right bronchus or lung: Secondary | ICD-10-CM | POA: Diagnosis not present

## 2021-10-21 DIAGNOSIS — R918 Other nonspecific abnormal finding of lung field: Principal | ICD-10-CM | POA: Diagnosis present

## 2021-10-21 DIAGNOSIS — Z8249 Family history of ischemic heart disease and other diseases of the circulatory system: Secondary | ICD-10-CM

## 2021-10-21 DIAGNOSIS — N3582 Other urethral stricture, female: Secondary | ICD-10-CM | POA: Diagnosis not present

## 2021-10-21 DIAGNOSIS — N3592 Unspecified urethral stricture, female: Secondary | ICD-10-CM | POA: Diagnosis not present

## 2021-10-21 DIAGNOSIS — Z79899 Other long term (current) drug therapy: Secondary | ICD-10-CM

## 2021-10-21 DIAGNOSIS — I10 Essential (primary) hypertension: Secondary | ICD-10-CM

## 2021-10-21 DIAGNOSIS — Z01818 Encounter for other preprocedural examination: Secondary | ICD-10-CM

## 2021-10-21 DIAGNOSIS — Z9981 Dependence on supplemental oxygen: Secondary | ICD-10-CM

## 2021-10-21 DIAGNOSIS — C3412 Malignant neoplasm of upper lobe, left bronchus or lung: Principal | ICD-10-CM | POA: Diagnosis present

## 2021-10-21 DIAGNOSIS — K219 Gastro-esophageal reflux disease without esophagitis: Secondary | ICD-10-CM | POA: Diagnosis present

## 2021-10-21 DIAGNOSIS — J9601 Acute respiratory failure with hypoxia: Secondary | ICD-10-CM | POA: Diagnosis not present

## 2021-10-21 DIAGNOSIS — Z5309 Procedure and treatment not carried out because of other contraindication: Secondary | ICD-10-CM | POA: Diagnosis present

## 2021-10-21 DIAGNOSIS — Z87891 Personal history of nicotine dependence: Secondary | ICD-10-CM | POA: Diagnosis not present

## 2021-10-21 DIAGNOSIS — Z452 Encounter for adjustment and management of vascular access device: Secondary | ICD-10-CM | POA: Diagnosis not present

## 2021-10-21 DIAGNOSIS — I517 Cardiomegaly: Secondary | ICD-10-CM | POA: Diagnosis not present

## 2021-10-21 HISTORY — DX: Prediabetes: R73.03

## 2021-10-21 LAB — MRSA NEXT GEN BY PCR, NASAL: MRSA by PCR Next Gen: NOT DETECTED

## 2021-10-21 LAB — SURGICAL PCR SCREEN

## 2021-10-21 LAB — GLUCOSE, CAPILLARY
Glucose-Capillary: 159 mg/dL — ABNORMAL HIGH (ref 70–99)
Glucose-Capillary: 104 mg/dL — ABNORMAL HIGH (ref 70–99)

## 2021-10-21 LAB — ABO/RH: ABO/RH(D): O POS

## 2021-10-21 LAB — HIV ANTIBODY (ROUTINE TESTING W REFLEX): HIV Screen 4th Generation wRfx: NONREACTIVE

## 2021-10-21 SURGERY — CANCELLED PROCEDURE
Anesthesia: General

## 2021-10-21 MED ORDER — MIDAZOLAM HCL 2 MG/2ML IJ SOLN
INTRAMUSCULAR | Status: DC | PRN
  Administered 2021-10-21: 2 mg via INTRAVENOUS

## 2021-10-21 MED ORDER — PROPOFOL 10 MG/ML IV BOLUS
INTRAVENOUS | Status: AC
  Filled 2021-10-21: qty 20

## 2021-10-21 MED ORDER — CHLORHEXIDINE GLUCONATE 0.12 % MT SOLN: 15.0000 mL | Freq: Once | OROMUCOSAL | Status: AC

## 2021-10-21 MED ORDER — PROPOFOL 10 MG/ML IV BOLUS
INTRAVENOUS | Status: DC | PRN
  Administered 2021-10-21: 130 mg via INTRAVENOUS

## 2021-10-21 MED ORDER — PHENYLEPHRINE HCL-NACL 20-0.9 MG/250ML-% IV SOLN
INTRAVENOUS | Status: DC | PRN
  Administered 2021-10-21: 10 ug/min via INTRAVENOUS

## 2021-10-21 MED ORDER — ENOXAPARIN SODIUM 40 MG/0.4ML IJ SOSY
40.0000 mg | PREFILLED_SYRINGE | Freq: Every day | INTRAMUSCULAR | Status: DC
  Administered 2021-10-21: 40 mg via SUBCUTANEOUS
  Filled 2021-10-21: qty 0.4

## 2021-10-21 MED ORDER — MAGNESIUM HYDROXIDE 400 MG/5ML PO SUSP: 30.0000 mL | Freq: Every day | ORAL | Status: DC | PRN

## 2021-10-21 MED ORDER — CHLORHEXIDINE GLUCONATE 0.12 % MT SOLN
OROMUCOSAL | Status: AC
  Administered 2021-10-21: 15 mL via OROMUCOSAL
  Filled 2021-10-21: qty 15

## 2021-10-21 MED ORDER — SUGAMMADEX SODIUM 200 MG/2ML IV SOLN
INTRAVENOUS | Status: DC | PRN
  Administered 2021-10-21: 400 mg via INTRAVENOUS

## 2021-10-21 MED ORDER — FENTANYL CITRATE (PF) 250 MCG/5ML IJ SOLN
INTRAMUSCULAR | Status: DC | PRN
  Administered 2021-10-21 (×2): 25 ug via INTRAVENOUS

## 2021-10-21 MED ORDER — CHLORHEXIDINE GLUCONATE CLOTH 2 % EX PADS
6.0000 | MEDICATED_PAD | Freq: Every day | CUTANEOUS | Status: DC
  Administered 2021-10-22: 6 via TOPICAL

## 2021-10-21 MED ORDER — TRIAMTERENE-HCTZ 37.5-25 MG PO TABS
1.0000 | ORAL_TABLET | Freq: Every day | ORAL | Status: DC
  Administered 2021-10-21 – 2021-10-24 (×3): 1 via ORAL
  Filled 2021-10-21 (×4): qty 1

## 2021-10-21 MED ORDER — ONDANSETRON HCL 4 MG/2ML IJ SOLN
INTRAMUSCULAR | Status: AC
Start: 2021-10-21 — End: ?
  Filled 2021-10-21: qty 2

## 2021-10-21 MED ORDER — ROCURONIUM BROMIDE 10 MG/ML (PF) SYRINGE
PREFILLED_SYRINGE | INTRAVENOUS | Status: DC | PRN
  Administered 2021-10-21: 80 mg via INTRAVENOUS

## 2021-10-21 MED ORDER — HYDROMORPHONE HCL 1 MG/ML IJ SOLN: 0.2500 mg | INTRAMUSCULAR | Status: DC | PRN

## 2021-10-21 MED ORDER — ASPIRIN 81 MG PO CHEW
81.0000 mg | CHEWABLE_TABLET | Freq: Every day | ORAL | Status: DC
  Administered 2021-10-21 – 2021-10-24 (×3): 81 mg via ORAL
  Filled 2021-10-21 (×3): qty 1

## 2021-10-21 MED ORDER — ONDANSETRON HCL 4 MG/2ML IJ SOLN
INTRAMUSCULAR | Status: DC | PRN
  Administered 2021-10-21: 4 mg via INTRAVENOUS

## 2021-10-21 MED ORDER — BUPIVACAINE HCL (PF) 0.5 % IJ SOLN
INTRAMUSCULAR | Status: AC
Start: 2021-10-21 — End: ?
  Filled 2021-10-21: qty 30

## 2021-10-21 MED ORDER — ONDANSETRON HCL 4 MG PO TABS
4.0000 mg | ORAL_TABLET | Freq: Four times a day (QID) | ORAL | Status: DC | PRN
Start: 2021-10-21 — End: 2021-10-22

## 2021-10-21 MED ORDER — ROCURONIUM BROMIDE 10 MG/ML (PF) SYRINGE
PREFILLED_SYRINGE | INTRAVENOUS | Status: AC
Start: 2021-10-21 — End: ?
  Filled 2021-10-21: qty 10

## 2021-10-21 MED ORDER — BUPIVACAINE LIPOSOME 1.3 % IJ SUSP
INTRAMUSCULAR | Status: AC
Start: 2021-10-21 — End: ?
  Filled 2021-10-21: qty 20

## 2021-10-21 MED ORDER — LIDOCAINE 2% (20 MG/ML) 5 ML SYRINGE
INTRAMUSCULAR | Status: AC
Start: 2021-10-21 — End: ?
  Filled 2021-10-21: qty 5

## 2021-10-21 MED ORDER — MIDAZOLAM HCL 2 MG/2ML IJ SOLN
INTRAMUSCULAR | Status: AC
  Filled 2021-10-21: qty 2

## 2021-10-21 MED ORDER — ONDANSETRON HCL 4 MG/2ML IJ SOLN
4.0000 mg | Freq: Four times a day (QID) | INTRAMUSCULAR | Status: DC | PRN
  Administered 2021-10-22: 4 mg via INTRAVENOUS

## 2021-10-21 MED ORDER — ACETAMINOPHEN 500 MG PO TABS
ORAL_TABLET | ORAL | Status: AC
  Administered 2021-10-21: 1000 mg via ORAL
  Filled 2021-10-21: qty 2

## 2021-10-21 MED ORDER — ACETAMINOPHEN 500 MG PO TABS: 1000.0000 mg | ORAL_TABLET | Freq: Once | ORAL | Status: AC

## 2021-10-21 MED ORDER — CEFAZOLIN SODIUM-DEXTROSE 2-4 GM/100ML-% IV SOLN
INTRAVENOUS | Status: AC
  Filled 2021-10-21: qty 100

## 2021-10-21 MED ORDER — ALPRAZOLAM 0.25 MG PO TABS
0.2500 mg | ORAL_TABLET | Freq: Every day | ORAL | Status: DC | PRN
  Administered 2021-10-21 – 2021-10-23 (×2): 0.25 mg via ORAL
  Filled 2021-10-21 (×2): qty 1

## 2021-10-21 MED ORDER — ACETAMINOPHEN 325 MG PO TABS
650.0000 mg | ORAL_TABLET | Freq: Four times a day (QID) | ORAL | Status: DC | PRN
  Administered 2021-10-21: 650 mg via ORAL
  Filled 2021-10-21: qty 2

## 2021-10-21 MED ORDER — ORAL CARE MOUTH RINSE: 15.0000 mL | Freq: Once | OROMUCOSAL | Status: AC

## 2021-10-21 MED ORDER — ACETAMINOPHEN 650 MG RE SUPP
650.0000 mg | Freq: Four times a day (QID) | RECTAL | Status: DC | PRN
Start: 2021-10-21 — End: 2021-10-22

## 2021-10-21 MED ORDER — VERAPAMIL HCL ER 120 MG PO TBCR
120.0000 mg | EXTENDED_RELEASE_TABLET | Freq: Two times a day (BID) | ORAL | Status: DC
  Administered 2021-10-21 – 2021-10-24 (×5): 120 mg via ORAL
  Filled 2021-10-21 (×8): qty 1

## 2021-10-21 MED ORDER — FENTANYL CITRATE (PF) 250 MCG/5ML IJ SOLN
INTRAMUSCULAR | Status: AC
  Filled 2021-10-21: qty 5

## 2021-10-21 MED ORDER — ATORVASTATIN CALCIUM 10 MG PO TABS
10.0000 mg | ORAL_TABLET | Freq: Every day | ORAL | Status: DC
  Administered 2021-10-21 – 2021-10-23 (×3): 10 mg via ORAL
  Filled 2021-10-21 (×4): qty 1

## 2021-10-21 MED ORDER — SODIUM CHLORIDE 0.9% FLUSH
3.0000 mL | Freq: Two times a day (BID) | INTRAVENOUS | Status: DC
  Administered 2021-10-21 – 2021-10-23 (×5): 3 mL via INTRAVENOUS

## 2021-10-21 MED ORDER — LACTATED RINGERS IV SOLN: INTRAVENOUS | Status: DC

## 2021-10-21 MED ORDER — FLUTICASONE PROPIONATE 50 MCG/ACT NA SUSP
1.0000 | Freq: Every day | NASAL | Status: DC | PRN
Start: 2021-10-21 — End: 2021-10-24

## 2021-10-21 MED ORDER — CEFAZOLIN SODIUM-DEXTROSE 2-4 GM/100ML-% IV SOLN: 2.0000 g | INTRAVENOUS | Status: DC

## 2021-10-21 SURGICAL SUPPLY — 86 items
BLADE CLIPPER SURG (BLADE) ×1 IMPLANT
BLADE SURG 11 STRL SS (BLADE) ×1 IMPLANT
BNDG COHESIVE 6X5 TAN STRL LF (GAUZE/BANDAGES/DRESSINGS) ×1 IMPLANT
CANISTER SUCT 3000ML PPV (MISCELLANEOUS) ×2 IMPLANT
CANNULA REDUC XI 12-8 STAPL (CANNULA)
CANNULA REDUCER 12-8 DVNC XI (CANNULA) ×2 IMPLANT
CATH TROCAR 20FR (CATHETERS) IMPLANT
CHLORAPREP W/TINT 26 (MISCELLANEOUS) ×1 IMPLANT
CLIP VESOCCLUDE MED 6/CT (CLIP) IMPLANT
CNTNR URN SCR LID CUP LEK RST (MISCELLANEOUS) ×5 IMPLANT
CONN ST 1/4X3/8  BEN (MISCELLANEOUS)
CONN ST 1/4X3/8 BEN (MISCELLANEOUS) IMPLANT
CONT SPEC 4OZ STRL OR WHT (MISCELLANEOUS)
DEFOGGER SCOPE WARMER CLEARIFY (MISCELLANEOUS) ×1 IMPLANT
DERMABOND ADVANCED (GAUZE/BANDAGES/DRESSINGS)
DERMABOND ADVANCED .7 DNX12 (GAUZE/BANDAGES/DRESSINGS) ×1 IMPLANT
DRAIN CHANNEL 28F RND 3/8 FF (WOUND CARE) IMPLANT
DRAPE ARM DVNC X/XI (DISPOSABLE) ×4 IMPLANT
DRAPE COLUMN DVNC XI (DISPOSABLE) ×1 IMPLANT
DRAPE CV SPLIT W-CLR ANES SCRN (DRAPES) ×1 IMPLANT
DRAPE DA VINCI XI ARM (DISPOSABLE)
DRAPE DA VINCI XI COLUMN (DISPOSABLE)
DRAPE ORTHO SPLIT 77X108 STRL (DRAPES)
DRAPE SURG ORHT 6 SPLT 77X108 (DRAPES) ×1 IMPLANT
ELECT BLADE 6.5 EXT (BLADE) IMPLANT
ELECT REM PT RETURN 9FT ADLT (ELECTROSURGICAL)
ELECTRODE REM PT RTRN 9FT ADLT (ELECTROSURGICAL) ×1 IMPLANT
GAUZE KITTNER 4X8 (MISCELLANEOUS) ×1 IMPLANT
GAUZE SPONGE 4X4 12PLY STRL (GAUZE/BANDAGES/DRESSINGS) ×1 IMPLANT
GLOVE BIO SURGEON STRL SZ7.5 (GLOVE) ×2 IMPLANT
GLOVE SURG POLYISO LF SZ8 (GLOVE) ×1 IMPLANT
GOWN STRL REUS W/ TWL LRG LVL3 (GOWN DISPOSABLE) ×2 IMPLANT
GOWN STRL REUS W/ TWL XL LVL3 (GOWN DISPOSABLE) ×3 IMPLANT
GOWN STRL REUS W/TWL 2XL LVL3 (GOWN DISPOSABLE) ×1 IMPLANT
GOWN STRL REUS W/TWL LRG LVL3 (GOWN DISPOSABLE)
GOWN STRL REUS W/TWL XL LVL3 (GOWN DISPOSABLE)
HEMOSTAT SURGICEL 2X14 (HEMOSTASIS) ×3 IMPLANT
IRRIGATION STRYKERFLOW (MISCELLANEOUS) IMPLANT
IRRIGATOR STRYKERFLOW (MISCELLANEOUS)
KIT BASIN OR (CUSTOM PROCEDURE TRAY) ×1 IMPLANT
KIT TURNOVER KIT B (KITS) ×1 IMPLANT
NEEDLE 22X1 1/2 (OR ONLY) (NEEDLE) ×1 IMPLANT
NS IRRIG 1000ML POUR BTL (IV SOLUTION) ×3 IMPLANT
PACK CHEST (CUSTOM PROCEDURE TRAY) ×1 IMPLANT
PAD ARMBOARD 7.5X6 YLW CONV (MISCELLANEOUS) ×5 IMPLANT
PORT ACCESS TROCAR AIRSEAL 12 (TROCAR) ×1 IMPLANT
PORT ACCESS TROCAR AIRSEAL 5M (TROCAR)
SEAL CANN UNIV 5-8 DVNC XI (MISCELLANEOUS) ×2 IMPLANT
SEAL XI 5MM-8MM UNIVERSAL (MISCELLANEOUS)
SEALANT PROGEL (MISCELLANEOUS) IMPLANT
SEALER LIGASURE MARYLAND 30 (ELECTROSURGICAL) IMPLANT
SET TRI-LUMEN FLTR TB AIRSEAL (TUBING) ×1 IMPLANT
SOLUTION ELECTROLUBE (MISCELLANEOUS) IMPLANT
SPONGE T-LAP 18X18 ~~LOC~~+RFID (SPONGE) ×4 IMPLANT
SPONGE TONSIL TAPE 1 RFD (DISPOSABLE) IMPLANT
STAPLER CANNULA SEAL DVNC XI (STAPLE) ×2 IMPLANT
STAPLER CANNULA SEAL XI (STAPLE)
STOPCOCK 4 WAY LG BORE MALE ST (IV SETS) ×1 IMPLANT
SUT MNCRL AB 3-0 PS2 18 (SUTURE) IMPLANT
SUT MON AB 2-0 CT1 36 (SUTURE) IMPLANT
SUT PDS AB 1 CTX 36 (SUTURE) IMPLANT
SUT PROLENE 4 0 RB 1 (SUTURE)
SUT PROLENE 4-0 RB1 .5 CRCL 36 (SUTURE) IMPLANT
SUT SILK  1 MH (SUTURE)
SUT SILK 1 MH (SUTURE) ×1 IMPLANT
SUT SILK 1 TIES 10X30 (SUTURE) IMPLANT
SUT SILK 2 0 SH (SUTURE) IMPLANT
SUT SILK 2 0SH CR/8 30 (SUTURE) IMPLANT
SUT VIC AB 1 CTX 36 (SUTURE)
SUT VIC AB 1 CTX36XBRD ANBCTR (SUTURE) IMPLANT
SUT VIC AB 2-0 CT1 27 (SUTURE)
SUT VIC AB 2-0 CT1 TAPERPNT 27 (SUTURE) ×1 IMPLANT
SUT VIC AB 3-0 SH 27 (SUTURE)
SUT VIC AB 3-0 SH 27X BRD (SUTURE) ×2 IMPLANT
SUT VICRYL 0 TIES 12 18 (SUTURE) ×1 IMPLANT
SUT VICRYL 0 UR6 27IN ABS (SUTURE) ×2 IMPLANT
SUT VICRYL 2 TP 1 (SUTURE) IMPLANT
SYR 10ML LL (SYRINGE) ×1 IMPLANT
SYR 20ML LL LF (SYRINGE) ×1 IMPLANT
SYR 50ML LL SCALE MARK (SYRINGE) ×1 IMPLANT
SYSTEM SAHARA CHEST DRAIN ATS (WOUND CARE) ×1 IMPLANT
TAPE CLOTH 4X10 WHT NS (GAUZE/BANDAGES/DRESSINGS) ×1 IMPLANT
TIP APPLICATOR SPRAY EXTEND 16 (VASCULAR PRODUCTS) IMPLANT
TOWEL GREEN STERILE (TOWEL DISPOSABLE) ×1 IMPLANT
TRAY FOLEY MTR SLVR 16FR STAT (SET/KITS/TRAYS/PACK) ×1 IMPLANT
TUBING EXTENTION W/L.L. (IV SETS) ×1 IMPLANT

## 2021-10-21 NOTE — Consult Note (Signed)
H&P  Chief Complaint: difficult foley  History of Present Illness: 71 yo female with a left upper lobe pulmonary nodule.  She was taken to the operating room today and anesthetized for a robotic assisted lobectomy, but nursing could not place a Foley catheter.  The procedure was canceled and urology consulted.  The patient is voiding without difficulty.  She has a good stream.  She does have some urge incontinence.  She has no urologic history or surgery.  She had 3 normal spontaneous vaginal deliveries.  Past Medical History:  Diagnosis Date   Anxiety    Depression    Diabetes ([place])    Dyspnea    GERD (gastroesophageal reflux disease)    HTN (hypertension)    Hyperglycemia    Pre-diabetes    Past Surgical History:  Procedure Laterality Date   CHOLECYSTECTOMY     ELBOW SURGERY      Home Medications:  Medications Prior to Admission  Medication Sig Dispense Refill Last Dose   ALPRAZolam (XANAX) 0.25 MG tablet Take 0.25 mg by mouth daily as needed for anxiety.   Past Week   aspirin 81 MG chewable tablet Chew 81 mg by mouth daily.   Past Week   atorvastatin (LIPITOR) 10 MG tablet Take 10 mg by mouth daily.   10/21/2021 at 0545   Cholecalciferol (VITAMIN D3) 10 MCG (400 UNIT) CAPS Take 400 Units by mouth daily.   Past Week   Fish Oil-Krill Oil (KRILL & FISH OIL BLEND PO) Take 1 capsule by mouth daily.   Past Week   fluticasone (FLONASE) 50 MCG/ACT nasal spray Place 1 spray into both nostrils daily as needed for allergies.   Past Month   Magnesium 250 MG TABS Take 250 mg by mouth daily.   Past Week   metFORMIN (GLUCOPHAGE) 500 MG tablet Take 500 mg by mouth daily before supper.   10/20/2021   Multiple Vitamins-Minerals (MULTIVITAMIN ADULTS 50+ PO) Take 1 tablet by mouth daily.   10/20/2021   nystatin ointment (MYCOSTATIN) Apply 1 application. topically daily as needed (irritation).   Past Week   triamterene-hydrochlorothiazide (DYAZIDE) 37.5-25 MG capsule Take 1 capsule by mouth daily.    10/20/2021   verapamil (CALAN-SR) 120 MG CR tablet Take 120 mg by mouth 2 (two) times daily.   10/21/2021 at 0545   diazepam (VALIUM) 5 MG tablet Take 1 tablet (5 mg total) by mouth as needed for anxiety (prior to imaging). (Patient not taking: Reported on 10/13/2021) 2 tablet 0 Not Taking   Allergies: No Known Allergies  Family History  Problem Relation Age of Onset   Heart failure Mother    Coronary artery disease Sister 26   Social History:  reports that she quit smoking about 4 years ago. Her smoking use included cigarettes. She has never used smokeless tobacco. She reports current alcohol use. She reports current drug use. Drug: Marijuana.  ROS: A complete review of systems was performed.  All systems are negative except for pertinent findings as noted. Review of Systems  All other systems reviewed and are negative.   Physical Exam:  Vital signs in last 24 hours: Temp:  [97.7 F (36.5 C)-98.6 F (37 C)] 97.9 F (36.6 C) (06/07 1931) Pulse Rate:  [58-79] 68 (06/07 1931) Resp:  [14-21] 16 (06/07 1931) BP: (111-160)/(61-94) 151/77 (06/07 1931) SpO2:  [90 %-99 %] 92 % (06/07 1931) Weight:  [401 kg] 112 kg (06/07 0640) General:  Alert and oriented, No acute distress HEENT: Normocephalic, atraumatic Cardiovascular: Regular  rate and rhythm Lungs: Regular rate and effort Abdomen: Soft, nontender, nondistended, no abdominal masses Back: No CVA tenderness Extremities: No edema Neurologic: Grossly intact GU: Nurse Dena was chaperone.  She assisted with retraction.  The vulva appeared normal without lesion.  The meatus is somewhat retracted and the tissues atrophic.  She had good support without a significant cystocele or rectocele.  It appeared that she had good vaginal depth. I believe I could see her meatus and it was edematous possible small urethrocele.  On palpation the bladder and the urethra were palpably normal.  I tried a straight 14 and 16 French catheter and then a coud 14 and  16 French catheter.  I believe I had engaged the meatus several times but could not get any of those to advance.  Laboratory Data:  Results for orders placed or performed during the hospital encounter of 10/21/21 (from the past 24 hour(s))  Surgical pcr screen     Status: Abnormal   Collection Time: 10/21/21  6:41 AM   Specimen: Nasal Mucosa; Nasal Swab  Result Value Ref Range   MRSA, PCR (A) NEGATIVE    INVALID, UNABLE TO DETERMINE THE PRESENCE OF TARGET DUE TO SPECIMEN INTEGRITY. RECOLLECTION REQUESTED.   Staphylococcus aureus (A) NEGATIVE    INVALID, UNABLE TO DETERMINE THE PRESENCE OF TARGET DUE TO SPECIMEN INTEGRITY. RECOLLECTION REQUESTED.  Glucose, capillary     Status: Abnormal   Collection Time: 10/21/21  6:43 AM  Result Value Ref Range   Glucose-Capillary 159 (H) 70 - 99 mg/dL  Glucose, capillary     Status: Abnormal   Collection Time: 10/21/21 10:08 AM  Result Value Ref Range   Glucose-Capillary 104 (H) 70 - 99 mg/dL  MRSA Next Gen by PCR, Nasal     Status: None   Collection Time: 10/21/21 12:28 PM   Specimen: Nasal Mucosa; Nasal Swab  Result Value Ref Range   MRSA by PCR Next Gen NOT DETECTED NOT DETECTED  ABO/Rh     Status: None   Collection Time: 10/21/21  2:50 PM  Result Value Ref Range   ABO/RH(D)      O POS Performed at [facility] [address] [place], [place] [address]   HIV Antibody (routine testing w rflx)     Status: None   Collection Time: 10/21/21  2:50 PM  Result Value Ref Range   HIV Screen 4th Generation wRfx Non Reactive Non Reactive   Recent Results (from the past 240 hour(s))  Surgical pcr screen     Status: Abnormal   Collection Time: 10/19/21  2:29 PM   Specimen: Nasal Mucosa; Nasal Swab  Result Value Ref Range Status   MRSA, PCR (A) NEGATIVE Final    INVALID, UNABLE TO DETERMINE THE PRESENCE OF TARGET DUE TO SPECIMEN INTEGRITY. RECOLLECTION REQUESTED.    Comment: EMAILED Charolett Bumpers [phone] @1842  FH   Staphylococcus aureus (A)  NEGATIVE Final    INVALID, UNABLE TO DETERMINE THE PRESENCE OF TARGET DUE TO SPECIMEN INTEGRITY. RECOLLECTION REQUESTED.    Comment: Performed at [facility], [place] [address] [place], [place] [address]  SARS CORONAVIRUS 2 (TAT 6-24 HRS) Anterior Nasal Swab     Status: None   Collection Time: 10/19/21  3:20 PM   Specimen: Anterior Nasal Swab  Result Value Ref Range Status   SARS Coronavirus 2 NEGATIVE NEGATIVE Final    Comment: (NOTE) SARS-CoV-2 target nucleic acids are NOT DETECTED.  The SARS-CoV-2 RNA is generally detectable in upper  and lower respiratory specimens during the acute phase of infection. Negative results do not preclude SARS-CoV-2 infection, do not rule out co-infections with other pathogens, and should not be used as the sole basis for treatment or other patient management decisions. Negative results must be combined with clinical observations, patient history, and epidemiological information. The expected result is Negative.  Fact Sheet for Patients: SugarRoll.be  Fact Sheet for Healthcare Providers: https://www.woods-mathews.com/  This test is not yet approved or cleared by the [place] FDA and  has been authorized for detection and/or diagnosis of SARS-CoV-2 by FDA under an Emergency Use Authorization (EUA). This EUA will remain  in effect (meaning this test can be used) for the duration of the COVID-19 declaration under Se ction 564(b)(1) of the Act, 21 U.S.C. section 360bbb-3(b)(1), unless the authorization is terminated or revoked sooner.  Performed at [facility], [place] [address] [place], [place] [address]   Surgical pcr screen     Status: Abnormal   Collection Time: 10/21/21  6:41 AM   Specimen: Nasal Mucosa; Nasal Swab  Result Value Ref Range Status   MRSA, PCR (A) NEGATIVE Final    INVALID, UNABLE TO DETERMINE THE PRESENCE OF TARGET DUE TO SPECIMEN INTEGRITY. RECOLLECTION REQUESTED.     Comment: RESULT CALLED TO, READ BACK BY AND VERIFIED WITH: RN JGarnet Sierras [phone] @1133  FH    Staphylococcus aureus (A) NEGATIVE Final    INVALID, UNABLE TO DETERMINE THE PRESENCE OF TARGET DUE TO SPECIMEN INTEGRITY. RECOLLECTION REQUESTED.    Comment: Performed at [facility], [place] [address] [place], [place] [address]  MRSA Next Gen by PCR, Nasal     Status: None   Collection Time: 10/21/21 12:28 PM   Specimen: Nasal Mucosa; Nasal Swab  Result Value Ref Range Status   MRSA by PCR Next Gen NOT DETECTED NOT DETECTED Final    Comment: (NOTE) The GeneXpert MRSA Assay (FDA approved for NASAL specimens only), is one component of a comprehensive MRSA colonization surveillance program. It is not intended to diagnose MRSA infection nor to guide or monitor treatment for MRSA infections. Test performance is not FDA approved in patients less than 90 years old. 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  Possible false passage.  Plan:  I discussed with the patient the nature, potential benefits, risks and alternatives to cystoscopy with urethral dilation and passage of Foley catheter, including side effects of the proposed treatment, the likelihood of the patient achieving the goals of the procedure, and any potential problems that might occur during the procedure or recuperation.  We discussed she may need a prolonged catheter after the procedure.  We discussed risk of bleeding, infection, stricture recurrence and incontinence among others.  All questions answered.  She elects to proceed.  I posted the procedure with the OR and we will plan to coordinate with Dr. Kipp Brood.  Festus Aloe 10/21/2021, 9:24 PM

## 2021-10-21 NOTE — TOC Progression Note (Signed)
Transition of Care [facility]) - Progression Note    Patient Details  Name: Kathryn [name] MRN: [id number] Date of Birth: 11-14-1950  Transition of Care [facility]) CM/SW [place], RN Phone [phone]  10/21/2021, 3:50 PM  Clinical Narrative:    TOC following patient admitted  left upper lobe pulmonary nodule. Plan for  robotic assisted lobectomy. The procedure was aborted due to inability to place a foley. Urology consulted. TOC acknowledges potential for disposition needs. TOC will continue to follow.  Transition of Care [facility]) Screening Note   Patient Details  Name: Kathryn [name] Date of Birth: 02-05-1951   Transition of Care [facility]) CM/SW Contact:    Angelita Ingles, RN Phone Number: 10/21/2021, 3:54 PM    Transition of Care [facility]) has reviewed patient and no TOC needs have been identified at this time. We will continue to monitor patient advancement through interdisciplinary progression rounds.             Expected Discharge Plan and Services                                                 Social Determinants of Health (SDOH) Interventions    Readmission Risk Interventions     View : No data to display.

## 2021-10-21 NOTE — Anesthesia Procedure Notes (Signed)
Procedure Name: Intubation Date/Time: 10/21/2021 8:59 AM Performed by: Valda Favia, CRNA Pre-anesthesia Checklist: Patient identified, Emergency Drugs available, Suction available, Patient being monitored and Timeout performed Patient Re-evaluated:Patient Re-evaluated prior to induction Oxygen Delivery Method: Circle system utilized Preoxygenation: Pre-oxygenation with 100% oxygen Induction Type: IV induction Ventilation: Mask ventilation without difficulty and Oral airway inserted - appropriate to patient size Laryngoscope Size: Glidescope and 3 Grade View: Grade I Endobronchial tube: Double lumen EBT, EBT position confirmed by auscultation, EBT position confirmed by fiberoptic bronchoscope and Left and 37 Fr Number of attempts: 1 Placement Confirmation: ETT inserted through vocal cords under direct vision, positive ETCO2 and breath sounds checked- equal and bilateral Tube secured with: Tape Dental Injury: Teeth and Oropharynx as per pre-operative assessment  Comments: [employer]

## 2021-10-21 NOTE — Anesthesia Procedure Notes (Signed)
Central Venous Catheter Insertion Performed by: Roderic Palau, MD, anesthesiologist Start/End6/11/2021 7:50 AM, 10/21/2021 8:05 AM Patient location: Pre-op. Preanesthetic checklist: patient identified, IV checked, site marked, risks and benefits discussed, surgical consent, monitors and equipment checked, pre-op evaluation, timeout performed and anesthesia consent Position: Trendelenburg Lidocaine 1% used for infiltration and patient sedated Hand hygiene performed , maximum sterile barriers used  and Seldinger technique used Catheter size: 8 Fr Total catheter length 16. Central line was placed.Double lumen Procedure performed using ultrasound guided technique. Ultrasound Notes:anatomy identified, needle tip was noted to be adjacent to the nerve/plexus identified, no ultrasound evidence of intravascular and/or intraneural injection and image(s) printed for medical record Attempts: 1 Following insertion, dressing applied, line sutured and Biopatch. Post procedure assessment: blood return through all ports  Patient tolerated the procedure well with no immediate complications.

## 2021-10-21 NOTE — Transfer of Care (Signed)
Immediate Anesthesia Transfer of Care Note  Patient: Kathryn [name]  Procedure(s) Performed: CANCELLED PROCEDURE  Patient Location: PACU  Anesthesia Type:General  Level of Consciousness: awake, alert  and oriented  Airway & Oxygen Therapy: Patient Spontanous Breathing and Patient connected to nasal cannula oxygen  Post-op Assessment: Report given to RN and Post -op Vital signs reviewed and stable  Post vital signs: Reviewed and stable  Last Vitals:  Vitals Value Taken Time  BP 118/72 10/21/21 1006  Temp    Pulse 60 10/21/21 1012  Resp 24 10/21/21 1012  SpO2 94 % 10/21/21 1012  Vitals shown include unvalidated device data.  Last Pain:  Vitals:   10/21/21 1005  TempSrc:   PainSc: 0-No pain         Complications: No notable events documented.

## 2021-10-21 NOTE — Anesthesia Postprocedure Evaluation (Signed)
Anesthesia Post Note  Patient: Kathryn [name]  Procedure(s) Performed: CANCELLED PROCEDURE     Patient location during evaluation: PACU Anesthesia Type: General Level of consciousness: awake and alert Pain management: pain level controlled Vital Signs Assessment: post-procedure vital signs reviewed and stable Respiratory status: spontaneous breathing, nonlabored ventilation, respiratory function stable and patient connected to nasal cannula oxygen Cardiovascular status: blood pressure returned to baseline and stable Postop Assessment: no apparent nausea or vomiting Anesthetic complications: no   No notable events documented.  Last Vitals:  Vitals:   10/21/21 1105 10/21/21 1135  BP: (!) 142/63 130/61  Pulse: (!) 58 60  Resp: 15 (!) 21  Temp:    SpO2: 98% 98%    Last Pain:  Vitals:   10/21/21 1135  TempSrc:   PainSc: 0-No pain                 Umar Patmon,W. EDMOND

## 2021-10-21 NOTE — Op Note (Signed)
      [address]       , [address]             [phone]        10/21/2021  Patient:  Kathryn [name] Pre-Op Dx: Left upper lobe pulmonary nodule   Post-op Dx:  same Procedure: - Aborted robotic assisted Lobectomy Surgeon and Role:      * Kunta Hilleary, Lucile Crater, MD - Primary    Anesthesia  general EBL:  none  Blood Administration: none Specimen:  none     Operative Technique: The procedure was aborted due to inability to place a foley.  Urology will be consulted, and will coordinate with them for 10/22/21  The patient was transferred to the PACU in stable condition.  Jolette Lana Bary Leriche

## 2021-10-21 NOTE — Interval H&P Note (Signed)
History and Physical Interval Note:  10/21/2021 8:38 AM  Kathryn [name]  has presented today for surgery, with the diagnosis of PULMONARY NODULE.  The various methods of treatment have been discussed with the patient and family. After consideration of risks, benefits and other options for treatment, the patient has consented to  Procedure(s): XI ROBOTIC ASSISTED THORASCOPY-LEFT UPPER LOBECTOMY (Left) as a surgical intervention.  The patient's history has been reviewed, patient examined, no change in status, stable for surgery.  I have reviewed the patient's chart and labs.  Questions were answered to the patient's satisfaction.     [name]

## 2021-10-21 NOTE — Progress Notes (Signed)
Admission from PACU by bed awake and alert.

## 2021-10-22 ENCOUNTER — Encounter (HOSPITAL_COMMUNITY): Payer: Self-pay | Admitting: Thoracic Surgery (Cardiothoracic Vascular Surgery)

## 2021-10-22 ENCOUNTER — Encounter (HOSPITAL_COMMUNITY)
Admission: RE | Disposition: A | Discharge status: Home / Self Care | Payer: Self-pay | Source: Home / Self Care | Attending: Thoracic Surgery (Cardiothoracic Vascular Surgery)

## 2021-10-22 ENCOUNTER — Inpatient Hospital Stay (HOSPITAL_COMMUNITY): Payer: Medicare Other | Admitting: Certified Registered"

## 2021-10-22 ENCOUNTER — Other Ambulatory Visit: Payer: Self-pay

## 2021-10-22 ENCOUNTER — Inpatient Hospital Stay (HOSPITAL_COMMUNITY): Payer: Medicare Other

## 2021-10-22 DIAGNOSIS — Z7984 Long term (current) use of oral hypoglycemic drugs: Secondary | ICD-10-CM

## 2021-10-22 DIAGNOSIS — E119 Type 2 diabetes mellitus without complications: Secondary | ICD-10-CM

## 2021-10-22 DIAGNOSIS — Z87891 Personal history of nicotine dependence: Secondary | ICD-10-CM

## 2021-10-22 DIAGNOSIS — I10 Essential (primary) hypertension: Secondary | ICD-10-CM

## 2021-10-22 DIAGNOSIS — C3411 Malignant neoplasm of upper lobe, right bronchus or lung: Secondary | ICD-10-CM

## 2021-10-22 DIAGNOSIS — C3412 Malignant neoplasm of upper lobe, left bronchus or lung: Secondary | ICD-10-CM

## 2021-10-22 HISTORY — PX: CYSTOSCOPY: SHX5120

## 2021-10-22 HISTORY — PX: INTERCOSTAL NERVE BLOCK: SHX5021

## 2021-10-22 HISTORY — PX: NODE DISSECTION: SHX5269

## 2021-10-22 LAB — GLUCOSE, CAPILLARY
Glucose-Capillary: 100 mg/dL — ABNORMAL HIGH (ref 70–99)
Glucose-Capillary: 118 mg/dL — ABNORMAL HIGH (ref 70–99)
Glucose-Capillary: 247 mg/dL — ABNORMAL HIGH (ref 70–99)

## 2021-10-22 SURGERY — LOBECTOMY, LUNG, ROBOT-ASSISTED, USING VATS
Anesthesia: General | Site: Chest | Laterality: Left

## 2021-10-22 MED ORDER — LIDOCAINE HCL URETHRAL/MUCOSAL 2 % EX GEL
CUTANEOUS | Status: AC
  Filled 2021-10-22: qty 11

## 2021-10-22 MED ORDER — LIDOCAINE 2% (20 MG/ML) 5 ML SYRINGE
INTRAMUSCULAR | Status: DC | PRN
  Administered 2021-10-22: 100 mg via INTRAVENOUS

## 2021-10-22 MED ORDER — ONDANSETRON HCL 4 MG/2ML IJ SOLN: 4.0000 mg | Freq: Four times a day (QID) | INTRAMUSCULAR | Status: DC | PRN

## 2021-10-22 MED ORDER — LACTATED RINGERS IV SOLN
INTRAVENOUS | Status: DC
Start: 2021-10-22 — End: 2021-10-22

## 2021-10-22 MED ORDER — 0.9 % SODIUM CHLORIDE (POUR BTL) OPTIME
TOPICAL | Status: DC | PRN
  Administered 2021-10-22: 2000 mL

## 2021-10-22 MED ORDER — PHENYLEPHRINE HCL-NACL 20-0.9 MG/250ML-% IV SOLN
INTRAVENOUS | Status: DC | PRN
  Administered 2021-10-22: 35 ug/min via INTRAVENOUS

## 2021-10-22 MED ORDER — CEFAZOLIN SODIUM-DEXTROSE 2-4 GM/100ML-% IV SOLN
INTRAVENOUS | Status: AC
  Filled 2021-10-22: qty 100

## 2021-10-22 MED ORDER — ONDANSETRON HCL 4 MG/2ML IJ SOLN
INTRAMUSCULAR | Status: AC
  Filled 2021-10-22: qty 2

## 2021-10-22 MED ORDER — GLYCOPYRROLATE PF 0.2 MG/ML IJ SOSY
PREFILLED_SYRINGE | INTRAMUSCULAR | Status: AC
  Filled 2021-10-22: qty 1

## 2021-10-22 MED ORDER — MIDAZOLAM HCL 2 MG/2ML IJ SOLN
INTRAMUSCULAR | Status: DC | PRN
  Administered 2021-10-22: 2 mg via INTRAVENOUS

## 2021-10-22 MED ORDER — BISACODYL 5 MG PO TBEC
10.0000 mg | DELAYED_RELEASE_TABLET | Freq: Every day | ORAL | Status: DC
  Administered 2021-10-23 – 2021-10-24 (×2): 10 mg via ORAL
  Filled 2021-10-22 (×2): qty 2

## 2021-10-22 MED ORDER — ROCURONIUM BROMIDE 10 MG/ML (PF) SYRINGE
PREFILLED_SYRINGE | INTRAVENOUS | Status: DC | PRN
  Administered 2021-10-22: 100 mg via INTRAVENOUS
  Administered 2021-10-22: 50 mg via INTRAVENOUS

## 2021-10-22 MED ORDER — CHLORHEXIDINE GLUCONATE 0.12 % MT SOLN
15.0000 mL | Freq: Once | OROMUCOSAL | Status: AC
  Administered 2021-10-22: 15 mL via OROMUCOSAL

## 2021-10-22 MED ORDER — ENOXAPARIN SODIUM 40 MG/0.4ML IJ SOSY
40.0000 mg | PREFILLED_SYRINGE | Freq: Every day | INTRAMUSCULAR | Status: DC
  Administered 2021-10-23 – 2021-10-24 (×2): 40 mg via SUBCUTANEOUS
  Filled 2021-10-22 (×2): qty 0.4

## 2021-10-22 MED ORDER — PROPOFOL 10 MG/ML IV BOLUS
INTRAVENOUS | Status: AC
  Filled 2021-10-22: qty 20

## 2021-10-22 MED ORDER — CEFAZOLIN SODIUM-DEXTROSE 2-4 GM/100ML-% IV SOLN
2.0000 g | Freq: Three times a day (TID) | INTRAVENOUS | Status: AC
  Administered 2021-10-22 – 2021-10-23 (×2): 2 g via INTRAVENOUS
  Filled 2021-10-22 (×2): qty 100

## 2021-10-22 MED ORDER — MIDAZOLAM HCL 2 MG/2ML IJ SOLN
INTRAMUSCULAR | Status: AC
  Filled 2021-10-22: qty 2

## 2021-10-22 MED ORDER — HEMOSTATIC AGENTS (NO CHARGE) OPTIME
TOPICAL | Status: DC | PRN
  Administered 2021-10-22: 1 via TOPICAL

## 2021-10-22 MED ORDER — LIDOCAINE 2% (20 MG/ML) 5 ML SYRINGE
INTRAMUSCULAR | Status: AC
  Filled 2021-10-22: qty 5

## 2021-10-22 MED ORDER — FENTANYL CITRATE (PF) 100 MCG/2ML IJ SOLN
25.0000 ug | INTRAMUSCULAR | Status: DC | PRN
  Administered 2021-10-22: 25 ug via INTRAVENOUS

## 2021-10-22 MED ORDER — TRAMADOL HCL 50 MG PO TABS
50.0000 mg | ORAL_TABLET | Freq: Four times a day (QID) | ORAL | Status: DC | PRN
  Administered 2021-10-22: 100 mg via ORAL
  Administered 2021-10-23 – 2021-10-24 (×3): 50 mg via ORAL
  Filled 2021-10-22 (×2): qty 1
  Filled 2021-10-22: qty 2
  Filled 2021-10-22: qty 1

## 2021-10-22 MED ORDER — SODIUM CHLORIDE FLUSH 0.9 % IV SOLN
INTRAVENOUS | Status: DC | PRN
  Administered 2021-10-22: 100 mL

## 2021-10-22 MED ORDER — PROPOFOL 10 MG/ML IV BOLUS
INTRAVENOUS | Status: DC | PRN
  Administered 2021-10-22: 100 mg via INTRAVENOUS

## 2021-10-22 MED ORDER — SENNOSIDES-DOCUSATE SODIUM 8.6-50 MG PO TABS
1.0000 | ORAL_TABLET | Freq: Every day | ORAL | Status: DC
  Administered 2021-10-22 – 2021-10-23 (×2): 1 via ORAL
  Filled 2021-10-22 (×2): qty 1

## 2021-10-22 MED ORDER — CEFAZOLIN SODIUM-DEXTROSE 2-3 GM-%(50ML) IV SOLR
INTRAVENOUS | Status: DC | PRN
  Administered 2021-10-22: 2 g via INTRAVENOUS

## 2021-10-22 MED ORDER — FENTANYL CITRATE (PF) 250 MCG/5ML IJ SOLN
INTRAMUSCULAR | Status: AC
  Filled 2021-10-22: qty 5

## 2021-10-22 MED ORDER — ROCURONIUM BROMIDE 10 MG/ML (PF) SYRINGE
PREFILLED_SYRINGE | INTRAVENOUS | Status: AC
  Filled 2021-10-22: qty 20

## 2021-10-22 MED ORDER — SUGAMMADEX SODIUM 200 MG/2ML IV SOLN
INTRAVENOUS | Status: DC | PRN
  Administered 2021-10-22: 200 mg via INTRAVENOUS

## 2021-10-22 MED ORDER — FENTANYL CITRATE (PF) 250 MCG/5ML IJ SOLN
INTRAMUSCULAR | Status: DC | PRN
  Administered 2021-10-22: 50 ug via INTRAVENOUS
  Administered 2021-10-22: 25 ug via INTRAVENOUS
  Administered 2021-10-22: 50 ug via INTRAVENOUS
  Administered 2021-10-22: 100 ug via INTRAVENOUS

## 2021-10-22 MED ORDER — DEXAMETHASONE SODIUM PHOSPHATE 10 MG/ML IJ SOLN
INTRAMUSCULAR | Status: AC
  Filled 2021-10-22: qty 1

## 2021-10-22 MED ORDER — ACETAMINOPHEN 500 MG PO TABS
1000.0000 mg | ORAL_TABLET | Freq: Four times a day (QID) | ORAL | Status: DC
  Administered 2021-10-22 – 2021-10-24 (×8): 1000 mg via ORAL
  Filled 2021-10-22 (×8): qty 2

## 2021-10-22 MED ORDER — FENTANYL CITRATE (PF) 100 MCG/2ML IJ SOLN
INTRAMUSCULAR | Status: AC
  Filled 2021-10-22: qty 2

## 2021-10-22 MED ORDER — ACETAMINOPHEN 160 MG/5ML PO SOLN: 1000.0000 mg | Freq: Four times a day (QID) | ORAL | Status: DC

## 2021-10-22 MED ORDER — AMISULPRIDE (ANTIEMETIC) 5 MG/2ML IV SOLN: 10.0000 mg | Freq: Once | INTRAVENOUS | Status: DC | PRN

## 2021-10-22 MED ORDER — GLYCOPYRROLATE 0.2 MG/ML IJ SOLN
INTRAMUSCULAR | Status: DC | PRN
  Administered 2021-10-22: .2 mg via INTRAVENOUS

## 2021-10-22 MED ORDER — LIDOCAINE HCL (CARDIAC) PF 100 MG/5ML IV SOSY
PREFILLED_SYRINGE | INTRAVENOUS | Status: AC
  Filled 2021-10-22: qty 5

## 2021-10-22 MED ORDER — KETOROLAC TROMETHAMINE 15 MG/ML IJ SOLN
15.0000 mg | Freq: Four times a day (QID) | INTRAMUSCULAR | Status: DC
  Administered 2021-10-22 – 2021-10-23 (×4): 15 mg via INTRAVENOUS
  Filled 2021-10-22 (×4): qty 1

## 2021-10-22 MED ORDER — BUPIVACAINE LIPOSOME 1.3 % IJ SUSP
INTRAMUSCULAR | Status: AC
  Filled 2021-10-22: qty 20

## 2021-10-22 MED ORDER — SUCCINYLCHOLINE CHLORIDE 200 MG/10ML IV SOSY
PREFILLED_SYRINGE | INTRAVENOUS | Status: AC
  Filled 2021-10-22: qty 10

## 2021-10-22 MED ORDER — INSULIN ASPART 100 UNIT/ML IJ SOLN
0.0000 [IU] | Freq: Four times a day (QID) | INTRAMUSCULAR | Status: DC
  Administered 2021-10-22: 8 [IU] via SUBCUTANEOUS
  Administered 2021-10-23 – 2021-10-24 (×4): 2 [IU] via SUBCUTANEOUS

## 2021-10-22 MED ORDER — BUPIVACAINE HCL (PF) 0.5 % IJ SOLN
INTRAMUSCULAR | Status: AC
  Filled 2021-10-22: qty 30

## 2021-10-22 MED ORDER — CHLORHEXIDINE GLUCONATE 0.12 % MT SOLN
OROMUCOSAL | Status: AC
  Administered 2021-10-22: 15 mL via OROMUCOSAL
  Filled 2021-10-22: qty 15

## 2021-10-22 MED ORDER — IOHEXOL 300 MG/ML  SOLN: INTRAMUSCULAR | Status: DC | PRN

## 2021-10-22 MED ORDER — ALBUTEROL SULFATE (2.5 MG/3ML) 0.083% IN NEBU
2.5000 mg | INHALATION_SOLUTION | RESPIRATORY_TRACT | Status: DC
  Filled 2021-10-22 (×2): qty 3

## 2021-10-22 MED ORDER — ORAL CARE MOUTH RINSE
15.0000 mL | Freq: Once | OROMUCOSAL | Status: AC
Start: 2021-10-22 — End: 2021-10-22

## 2021-10-22 MED ORDER — DEXAMETHASONE SODIUM PHOSPHATE 10 MG/ML IJ SOLN
INTRAMUSCULAR | Status: DC | PRN
  Administered 2021-10-22: 10 mg via INTRAVENOUS

## 2021-10-22 MED ORDER — PROMETHAZINE HCL 25 MG/ML IJ SOLN: 6.2500 mg | INTRAMUSCULAR | Status: DC | PRN

## 2021-10-22 SURGICAL SUPPLY — 142 items
BAG COUNTER SPONGE SURGICOUNT (BAG) ×3 IMPLANT
BAG URINE DRAINAGE (UROLOGICAL SUPPLIES) IMPLANT
BAG URO CATCHER STRL LF (MISCELLANEOUS) ×3 IMPLANT
BALLN NEPHROMAX 24X8X12 (BALLOONS)
BALLN NEPHROSTOMY (BALLOONS)
BALLOON NEPHROMAX 24X8X12 (BALLOONS) IMPLANT
BALLOON NEPHROSTOMY (BALLOONS) IMPLANT
BLADE CLIPPER SURG (BLADE) ×3 IMPLANT
BLADE SURG 11 STRL SS (BLADE) ×3 IMPLANT
BNDG COHESIVE 6X5 TAN STRL LF (GAUZE/BANDAGES/DRESSINGS) ×3 IMPLANT
CANISTER SUCT 3000ML PPV (MISCELLANEOUS) ×6 IMPLANT
CANNULA REDUC XI 12-8 STAPL (CANNULA) ×6
CANNULA REDUCER 12-8 DVNC XI (CANNULA) ×4 IMPLANT
CATH FOLEY 2W COUNCIL 20FR 5CC (CATHETERS) IMPLANT
CATH FOLEY 2W COUNCIL 5CC 16FR (CATHETERS) IMPLANT
CATH FOLEY 2W COUNCIL 5CC 18FR (CATHETERS) ×1 IMPLANT
CATH FOLEY 2WAY  3CC 10FR (CATHETERS)
CATH FOLEY 2WAY 3CC 10FR (CATHETERS) IMPLANT
CATH ROBINSON RED A/P 14FR (CATHETERS) IMPLANT
CATH THORACIC 28FR (CATHETERS) ×1 IMPLANT
CATH TROCAR 20FR (CATHETERS) IMPLANT
CHLORAPREP W/TINT 26 (MISCELLANEOUS) ×4 IMPLANT
CLIP VESOCCLUDE MED 6/CT (CLIP) IMPLANT
CNTNR URN SCR LID CUP LEK RST (MISCELLANEOUS) ×10 IMPLANT
CONN ST 1/4X3/8  BEN (MISCELLANEOUS)
CONN ST 1/4X3/8 BEN (MISCELLANEOUS) IMPLANT
CONT SPEC 4OZ STRL OR WHT (MISCELLANEOUS) ×15
DEFOGGER SCOPE WARMER CLEARIFY (MISCELLANEOUS) ×3 IMPLANT
DERMABOND ADVANCED (GAUZE/BANDAGES/DRESSINGS) ×1
DERMABOND ADVANCED .7 DNX12 (GAUZE/BANDAGES/DRESSINGS) ×2 IMPLANT
DISSECTOR BLUNT TIP ENDO 5MM (MISCELLANEOUS) IMPLANT
DRAIN CHANNEL 28F RND 3/8 FF (WOUND CARE) IMPLANT
DRAPE ARM DVNC X/XI (DISPOSABLE) ×8 IMPLANT
DRAPE COLUMN DVNC XI (DISPOSABLE) ×2 IMPLANT
DRAPE CV SPLIT W-CLR ANES SCRN (DRAPES) ×3 IMPLANT
DRAPE DA VINCI XI ARM (DISPOSABLE) ×12
DRAPE DA VINCI XI COLUMN (DISPOSABLE) ×3
DRAPE ORTHO SPLIT 77X108 STRL (DRAPES) ×3
DRAPE SURG ORHT 6 SPLT 77X108 (DRAPES) ×2 IMPLANT
ELECT BLADE 6.5 EXT (BLADE) IMPLANT
ELECT REM PT RETURN 9FT ADLT (ELECTROSURGICAL) ×6
ELECTRODE REM PT RTRN 9FT ADLT (ELECTROSURGICAL) ×2 IMPLANT
GAUZE 4X4 16PLY ~~LOC~~+RFID DBL (SPONGE) ×2 IMPLANT
GAUZE KITTNER 4X5 RF (MISCELLANEOUS) ×1 IMPLANT
GAUZE KITTNER 4X8 (MISCELLANEOUS) ×3 IMPLANT
GAUZE SPONGE 4X4 12PLY STRL (GAUZE/BANDAGES/DRESSINGS) ×3 IMPLANT
GLOVE BIO SURGEON STRL SZ7.5 (GLOVE) ×9 IMPLANT
GLOVE SURG POLYISO LF SZ8 (GLOVE) ×3 IMPLANT
GOWN STRL REUS W/ TWL LRG LVL3 (GOWN DISPOSABLE) ×6 IMPLANT
GOWN STRL REUS W/ TWL XL LVL3 (GOWN DISPOSABLE) ×8 IMPLANT
GOWN STRL REUS W/TWL 2XL LVL3 (GOWN DISPOSABLE) ×3 IMPLANT
GOWN STRL REUS W/TWL LRG LVL3 (GOWN DISPOSABLE) ×9
GOWN STRL REUS W/TWL XL LVL3 (GOWN DISPOSABLE) ×12
GUIDEWIRE ANG ZIPWIRE 038X150 (WIRE) ×1 IMPLANT
GUIDEWIRE STR DUAL SENSOR (WIRE) IMPLANT
HEMOSTAT SURGICEL 2X14 (HEMOSTASIS) ×7 IMPLANT
HOLDER FOLEY CATH W/STRAP (MISCELLANEOUS) IMPLANT
IRRIGATION STRYKERFLOW (MISCELLANEOUS) IMPLANT
IRRIGATOR STRYKERFLOW (MISCELLANEOUS)
IV NS IRRIG 3000ML ARTHROMATIC (IV SOLUTION) IMPLANT
KIT BASIN OR (CUSTOM PROCEDURE TRAY) ×3 IMPLANT
KIT TURNOVER KIT B (KITS) ×6 IMPLANT
MANIFOLD NEPTUNE II (INSTRUMENTS) IMPLANT
NDL HYPO 18GX1.5 BLUNT FILL (NEEDLE) IMPLANT
NEEDLE 22X1 1/2 (OR ONLY) (NEEDLE) ×3 IMPLANT
NEEDLE HYPO 18GX1.5 BLUNT FILL (NEEDLE) IMPLANT
NS IRRIG 1000ML POUR BTL (IV SOLUTION) ×12 IMPLANT
PACK CHEST (CUSTOM PROCEDURE TRAY) ×3 IMPLANT
PACK CYSTO (CUSTOM PROCEDURE TRAY) ×3 IMPLANT
PAD ARMBOARD 7.5X6 YLW CONV (MISCELLANEOUS) ×15 IMPLANT
PORT ACCESS TROCAR AIRSEAL 12 (TROCAR) ×2 IMPLANT
PORT ACCESS TROCAR AIRSEAL 5M (TROCAR) ×1
POUCH ENDO CATCH II 15MM (MISCELLANEOUS) IMPLANT
RELOAD STAPLE 45 2.0 GRY DVNC (STAPLE) IMPLANT
RELOAD STAPLE 45 2.5 WHT DVNC (STAPLE) IMPLANT
RELOAD STAPLE 45 3.5 BLU DVNC (STAPLE) IMPLANT
RELOAD STAPLE 45 4.3 GRN DVNC (STAPLE) IMPLANT
RELOAD STAPLER 2.5X45 WHT DVNC (STAPLE) ×10 IMPLANT
RELOAD STAPLER 3.5X45 BLU DVNC (STAPLE) ×4 IMPLANT
RELOAD STAPLER 4.3X45 GRN DVNC (STAPLE) ×2 IMPLANT
RETRACTOR WOUND ALXS 19CM XSML (INSTRUMENTS) IMPLANT
RTRCTR WOUND ALEXIS 19CM XSML (INSTRUMENTS)
SCISSORS LAP 5X35 DISP (ENDOMECHANICALS) IMPLANT
SEAL CANN UNIV 5-8 DVNC XI (MISCELLANEOUS) ×4 IMPLANT
SEAL XI 5MM-8MM UNIVERSAL (MISCELLANEOUS) ×6
SEALANT PROGEL (MISCELLANEOUS) IMPLANT
SEALER LIGASURE MARYLAND 30 (ELECTROSURGICAL) IMPLANT
SENSORWIRE 0.038 NOT ANGLED (WIRE) ×3
SET TRI-LUMEN FLTR TB AIRSEAL (TUBING) ×3 IMPLANT
SOL PREP POV-IOD 4OZ 10% (MISCELLANEOUS) ×3 IMPLANT
SOLUTION ELECTROLUBE (MISCELLANEOUS) IMPLANT
SPONGE INTESTINAL PEANUT (DISPOSABLE) IMPLANT
SPONGE T-LAP 18X18 ~~LOC~~+RFID (SPONGE) ×10 IMPLANT
SPONGE TONSIL TAPE 1 RFD (DISPOSABLE) IMPLANT
STAPLE RELOAD 45 2.0 GRAY (STAPLE) ×6
STAPLE RELOAD 45 2.0 GRAY DVNC (STAPLE) ×4 IMPLANT
STAPLER 45 SUREFORM CVD (STAPLE) ×3
STAPLER 45 SUREFORM CVD DVNC (STAPLE) IMPLANT
STAPLER CANNULA SEAL DVNC XI (STAPLE) ×4 IMPLANT
STAPLER CANNULA SEAL XI (STAPLE) ×6
STAPLER RELOAD 2.5X45 WHITE (STAPLE) ×15
STAPLER RELOAD 2.5X45 WHT DVNC (STAPLE) ×10
STAPLER RELOAD 3.5X45 BLU DVNC (STAPLE) ×4
STAPLER RELOAD 3.5X45 BLUE (STAPLE) ×6
STAPLER RELOAD 4.3X45 GREEN (STAPLE) ×3
STAPLER RELOAD 4.3X45 GRN DVNC (STAPLE) ×2
STOPCOCK 4 WAY LG BORE MALE ST (IV SETS) ×3 IMPLANT
SUT MNCRL AB 3-0 PS2 18 (SUTURE) IMPLANT
SUT MON AB 2-0 CT1 36 (SUTURE) IMPLANT
SUT PDS AB 1 CTX 36 (SUTURE) IMPLANT
SUT PROLENE 4 0 RB 1 (SUTURE)
SUT PROLENE 4-0 RB1 .5 CRCL 36 (SUTURE) IMPLANT
SUT SILK  1 MH (SUTURE) ×3
SUT SILK 1 MH (SUTURE) ×2 IMPLANT
SUT SILK 1 TIES 10X30 (SUTURE) IMPLANT
SUT SILK 2 0 SH (SUTURE) IMPLANT
SUT SILK 2 0SH CR/8 30 (SUTURE) IMPLANT
SUT VIC AB 1 CTX 36 (SUTURE)
SUT VIC AB 1 CTX36XBRD ANBCTR (SUTURE) IMPLANT
SUT VIC AB 2-0 CT1 27 (SUTURE) ×3
SUT VIC AB 2-0 CT1 TAPERPNT 27 (SUTURE) ×2 IMPLANT
SUT VIC AB 2-0 CTX 36 (SUTURE) ×1 IMPLANT
SUT VIC AB 3-0 SH 27 (SUTURE) ×9
SUT VIC AB 3-0 SH 27X BRD (SUTURE) ×4 IMPLANT
SUT VICRYL 0 TIES 12 18 (SUTURE) ×3 IMPLANT
SUT VICRYL 0 UR6 27IN ABS (SUTURE) ×6 IMPLANT
SUT VICRYL 2 TP 1 (SUTURE) IMPLANT
SYR 10ML LL (SYRINGE) ×3 IMPLANT
SYR 20ML LL LF (SYRINGE) ×3 IMPLANT
SYR 50ML LL SCALE MARK (SYRINGE) ×3 IMPLANT
SYRINGE IRR TOOMEY STRL 70CC (SYRINGE) IMPLANT
SYSTEM SAHARA CHEST DRAIN ATS (WOUND CARE) ×3 IMPLANT
TAPE CLOTH 4X10 WHT NS (GAUZE/BANDAGES/DRESSINGS) ×3 IMPLANT
TAPE CLOTH SURG 4X10 WHT LF (GAUZE/BANDAGES/DRESSINGS) ×1 IMPLANT
TIP APPLICATOR SPRAY EXTEND 16 (VASCULAR PRODUCTS) IMPLANT
TOWEL GREEN STERILE (TOWEL DISPOSABLE) ×3 IMPLANT
TRAY FOLEY MTR SLVR 16FR STAT (SET/KITS/TRAYS/PACK) ×3 IMPLANT
TUBE CONNECTING 12X1/4 (SUCTIONS) IMPLANT
TUBING EXTENTION W/L.L. (IV SETS) ×3 IMPLANT
WATER STERILE IRR 1000ML POUR (IV SOLUTION) ×3 IMPLANT
WATER STERILE IRR 3000ML UROMA (IV SOLUTION) ×3 IMPLANT
WIRE SENSOR 0.038 NOT ANGLED (WIRE) IMPLANT

## 2021-10-22 NOTE — Brief Op Note (Signed)
10/21/2021 - 10/22/2021  2:42 PM  PATIENT:  Kathryn [name]  71 y.o. female  PRE-OPERATIVE DIAGNOSIS:  pulmonary nodule  POST-OPERATIVE DIAGNOSIS:  pulmonary nodule  PROCEDURE:  Procedure(s) with comments: XI ROBOTIC ASSISTED THORASCOPY-LEFT UPPER LOBECTOMY (Left) - will need assistance from urology for catheter placement CYSTOSCOPY, Urethral dilation, foley placement INTERCOSTAL NERVE BLOCK (Left) NODE DISSECTION (Left)  SURGEON:  Surgeon(s) and Role: Panel 1:    * Lightfoot, Lucile Crater, MD - Primary Panel 2:    * Festus Aloe, MD - Primary  PHYSICIAN ASSISTANT: Winthrop Shannahan PA-C  ANESTHESIA:   general  EBL:  50 mL   BLOOD ADMINISTERED:none  DRAINS:  1 CHEST TUBE IN LEFT HEMITHORAX    LOCAL MEDICATIONS USED:  BUPIVICAINE  and OTHER EXPAREL  SPECIMEN:  Source of Specimen:  LEFT UPPER LOBE AND MULTIPLE LN SAMPLES  DISPOSITION OF SPECIMEN:  PATHOLOGY  COUNTS:  YES  TOURNIQUET:  * No tourniquets in log *  DICTATION: .Dragon Dictation  PLAN OF CARE: Admit to inpatient   PATIENT DISPOSITION:  PACU - hemodynamically stable.   Delay start of Pharmacological VTE agent (>24hrs) due to surgical blood loss or risk of bleeding: no  COMPLICATIONS: NO KNOWN

## 2021-10-22 NOTE — Plan of Care (Signed)
  Problem: Education: Goal: Knowledge of General Education information will improve Description: Including pain rating scale, medication(s)/side effects and non-pharmacologic comfort measures Outcome: Progressing   Problem: Elimination: Goal: Will not experience complications related to urinary retention Outcome: Progressing   Problem: Skin Integrity: Goal: Risk for impaired skin integrity will decrease Outcome: Progressing

## 2021-10-22 NOTE — Hospital Course (Addendum)
Referring: Garner Nash, DO Primary Care: Aura Dials, MD Primary Cardiologist: Minus Breeding, MD  History of Present Illness:    Kathryn [name] 71 y.o. female referred by Dr. Valeta Harms for surgical evaluation of a 2.5 cm PET avid left upper lobe pulmonary nodule.  This was found on lung cancer screening.  She does have a long history of smoking but quit 40 years ago.  She also has a history of chronic cough.  She was seen by Dr. Kipp Brood who evaluated the patient and all relevant studies and recommended proceeding with lobectomy.  She was admitted to the hospital electively on 10/21/2021 for the procedure  Hospital course: The patient was initially taken to the OR on 10/21/2021 but was found to have significant difficulty while attempting to place a Foley catheter requiring postponement of the procedure as well as obtaining a urology consult.  She was seen by Dr. Junious Silk who felt she would require preoperative cystoscopy and urethral dilatation for Foley catheter placement.  She subsequently went to the operating room on 10/22/2021 and underwent the following procedure:  Cystoscopy diascopy with urethral dilatation and Foley catheter placement by Dr. Junious Silk.  This was followed by robotic assisted left upper lobe lobectomy with lymph node dissection by Dr. Kipp Brood.  She tolerated these procedures well and was taken to the postanesthesia care unit in stable condition.  Postoperative hospital course:  The patient has done well.  She has remained afebrile with stable vital signs.  Her blood pressure medications have been resumed.Her Foley was removed on postoperative day #1.  Her chest tube was removed on postoperative day #1.  She has a mild postoperative reactive leukocytosis which is being monitored clinically.  She is not anemic.  Incisions are noted to be healing well without evidence of infection.  She is tolerating routine activity advancement.  Final Pathology is pending.  Oxygen has been  weaned to 2 L and she qualified for home oxygen which was arranged.  Incisions were noted to be healing well without evidence of infection.  She was tolerating gradually increasing activity and at the time of discharge was felt to be stable.

## 2021-10-22 NOTE — Anesthesia Procedure Notes (Addendum)
Procedure Name: Intubation Date/Time: 10/22/2021 12:05 PM  Performed by: Lavell Luster, CRNAPre-anesthesia Checklist: Patient identified, Emergency Drugs available, Suction available, Patient being monitored and Timeout performed Patient Re-evaluated:Patient Re-evaluated prior to induction Oxygen Delivery Method: Circle system utilized Preoxygenation: Pre-oxygenation with 100% oxygen Induction Type: IV induction Ventilation: Mask ventilation without difficulty Laryngoscope Size: Mac and 3 Grade View: Grade I Endobronchial tube: EBT position confirmed by fiberoptic bronchoscope, Left and Double lumen EBT and 35 Fr Number of attempts: 1 Airway Equipment and Method: Stylet Placement Confirmation: ETT inserted through vocal cords under direct vision, positive ETCO2 and breath sounds checked- equal and bilateral Secured at: 27 cm Tube secured with: Tape Dental Injury: Teeth and Oropharynx as per pre-operative assessment

## 2021-10-22 NOTE — Transfer of Care (Signed)
Immediate Anesthesia Transfer of Care Note  Patient: Kathryn [name]  Procedure(s) Performed: XI ROBOTIC ASSISTED THORASCOPY-LEFT UPPER LOBECTOMY (Left: Chest) INTERCOSTAL NERVE BLOCK (Left: Chest) NODE DISSECTION (Left: Chest) CYSTOSCOPY, Urethral dilation, foley placement (Bladder)  Patient Location: PACU  Anesthesia Type:General  Level of Consciousness: awake, alert  and oriented  Airway & Oxygen Therapy: Patient connected to face mask oxygen  Post-op Assessment: Post -op Vital signs reviewed and stable  Post vital signs: stable  Last Vitals:  Vitals Value Taken Time  BP 106/59 10/22/21 1511  Temp    Pulse 75 10/22/21 1511  Resp 11 10/22/21 1511  SpO2 90 % 10/22/21 1511  Vitals shown include unvalidated device data.  Last Pain:  Vitals:   10/22/21 1038  TempSrc:   PainSc: 0-No pain         Complications: No notable events documented.

## 2021-10-22 NOTE — Progress Notes (Signed)
     [address]       [place] [address]             [phone]       No events Vitals:   10/22/21 1010 10/22/21 1038  BP: 137/68 126/72  Pulse: 62 70  Resp: 18 17  Temp: 97.8 F (36.6 C)   SpO2: 95%    Alert NAD Sinus EWOB  OR today for L RATS, LULectomy  Kathryn [name] O Jorah Hua

## 2021-10-22 NOTE — Anesthesia Preprocedure Evaluation (Addendum)
Anesthesia Evaluation  Patient identified by MRN, date of birth, ID band Patient awake    Reviewed: Allergy & Precautions, H&P , NPO status , Patient's Chart, lab work & pertinent test results  History of Anesthesia Complications Negative for: history of anesthetic complications  Airway Mallampati: II   Neck ROM: Full    Dental  (+) Partial Upper, Partial Lower, Dental Advisory Given   Pulmonary neg pulmonary ROS, former smoker,    Pulmonary exam normal        Cardiovascular Exercise Tolerance: Good hypertension, Pt. on medications Normal cardiovascular exam     Neuro/Psych Anxiety Depression negative neurological ROS     GI/Hepatic Neg liver ROS, GERD  ,  Endo/Other  diabetes, Type 2, Oral Hypoglycemic AgentsMorbid obesity  Renal/GU negative Renal ROS  negative genitourinary   Musculoskeletal   Abdominal   Peds  Hematology negative hematology ROS (+)   Anesthesia Other Findings   Reproductive/Obstetrics negative OB ROS                            Anesthesia Physical  Anesthesia Plan  ASA: 3  Anesthesia Plan: General   Post-op Pain Management: Tylenol PO (pre-op)*   Induction: Intravenous  PONV Risk Score and Plan: 4 or greater and Ondansetron, Dexamethasone and Treatment may vary due to age or medical condition  Airway Management Planned: Double Lumen EBT  Additional Equipment: ClearSight and CVP  Intra-op Plan:   Post-operative Plan: Extubation in OR  Informed Consent: I have reviewed the patients History and Physical, chart, labs and discussed the procedure including the risks, benefits and alternatives for the proposed anesthesia with the patient or authorized representative who has indicated his/her understanding and acceptance.     Dental advisory given  Plan Discussed with: Anesthesiologist and CRNA  Anesthesia Plan Comments:        Anesthesia Quick  Evaluation

## 2021-10-22 NOTE — Anesthesia Postprocedure Evaluation (Signed)
Anesthesia Post Note  Patient: Kathryn [name]  Procedure(s) Performed: XI ROBOTIC ASSISTED THORASCOPY-LEFT UPPER LOBECTOMY (Left: Chest) INTERCOSTAL NERVE BLOCK (Left: Chest) NODE DISSECTION (Left: Chest) CYSTOSCOPY, Urethral dilation, foley placement (Bladder)     Patient location during evaluation: PACU Anesthesia Type: General Level of consciousness: sedated Pain management: pain level controlled Vital Signs Assessment: post-procedure vital signs reviewed and stable Respiratory status: spontaneous breathing and respiratory function stable Cardiovascular status: stable Postop Assessment: no apparent nausea or vomiting Anesthetic complications: no   No notable events documented.  Last Vitals:  Vitals:   10/22/21 1730 10/22/21 1800  BP: 122/69 130/71  Pulse: 77 83  Resp: 12 15  Temp:    SpO2: 92% 93%    Last Pain:  Vitals:   10/22/21 1606  TempSrc:   PainSc: 8                  Candon Caras DANIEL

## 2021-10-22 NOTE — Progress Notes (Signed)
Patient via bed to OR

## 2021-10-22 NOTE — Op Note (Signed)
      [address]       ,[place] [address]             [phone]        10/22/2021  Patient:  Kathryn [name] Pre-Op Dx: left upper lobe pulmonary nodule   Post-op Dx:  left upper lobe NSCLC Procedure: - Robotic assisted left video thoracoscopy - left upper lobectomy - Mediastinal lymph node sampling - Intercostal nerve block  Surgeon and Role:      * Raquan Iannone, Lucile Crater, MD - Primary  Assistant: Evonnie Pat, PA-C  An experienced assistant was required given the complexity of this surgery and the standard of surgical care. The assistant was needed for exposure, dissection, suctioning, retraction of delicate tissues and sutures, instrument exchange and for overall help during this procedure.    Anesthesia  general EBL:  100 ml Blood Administration: none Specimen:  left upper lobe.  Hilar and mediastinal lymph nodes  Drains: 28 F argyle chest tube in left chest Counts: correct   Indications: 71 yo female with 2.5 cm left upper lobe pulmonary nodule concerning for primary lung cancer.  Will plan for L RATS, LULectomy.  The risks and benefits have been discussed and she is agreeable proceeding.  Findings: Normal anatomy.  Normal appearing lymph nodes  Operative Technique: After the risks, benefits and alternatives were thoroughly discussed, the patient was brought to the operative theatre.  Anesthesia was induced, and the patient was then placed in a right lateral decubitus position and was prepped and draped in normal sterile fashion.  An appropriate surgical pause was performed, and pre-operative antibiotics were dosed accordingly.  We began by placing our 4 robotic ports in the the 7th intercostal space targeting the hilum of the lung.  A 57mm assistant port was placed in the 9th intercostal space in the anterior axillary line.  The robot was then docked and all instruments were passed under direct visualization.    The lung was then retracted superiorly, and  the inferior pulmonary ligament was divided.  The hilum was mobilized anteriorly and posteriorly.  We identified the upper lobe vein, and after careful isolation, it was divided with a vascular stapler.  We next moved to the  fissure.  The artery was then divided with a vascular load stapler.  The bronchus to the upper was then isolated.  After a test clamp, with good ventilation of the lower lobe, the bronchus was then divided.  The fissure was completed, and the specimen was passed into an endocatch bag.  It was removed from the anterior access site.    Lymph nodes were then sampled from the hilum and mediastinum.  The chest was irrigated, and an air leak test was performed.  An intercostal nerve block was performed under direct visualization.  A 28 F chest tube was then placed, and we watch the remaining lobes re-expand.  The skin and soft tissue were closed with absorbable suture    The patient tolerated the procedure without any immediate complications, and was transferred to the PACU in stable condition.  Chung Chagoya Bary Leriche

## 2021-10-22 NOTE — Plan of Care (Signed)

## 2021-10-22 NOTE — Op Note (Signed)
Preoperative diagnosis: Difficult Foley, urethrocele Postoperative diagnosis: Difficult Foley, urethrocele, meatal stricture  Procedure: Cystoscopy with urethral dilation and Foley catheter placement  Surgeon: Junious Silk  Anesthesia: General  Indication for procedure: Kathryn [name] is a 71 year old female undergoing a left lobectomy.  Nursing and urology could not pass the catheter.  She was brought back today to the OR for above procedure and her lobectomy.  Findings: On exam under anesthesia the vulva appeared normal without mass or lesion.  There was moderate vaginal atrophy.  No prolapse.  Good apical support.  On exam the cervix was palpably normal.  The bladder was palpably normal without mass.  The urethra was palpably normal.  The meatus had a small urethrocele but no mass.  On cystoscopy there may have been a very distal or meatal stricture.  There was a small urethrocele.  The remainder of the urethra was normal.  The bladder appeared normal.  There was no stone or foreign body in the bladder.  No mucosal lesion.  The trigone and ureteral orifice ease appeared in the normal position.  Description of procedure: After consent was obtained patient brought to the operating room.  After adequate anesthesia she was placed in the lithotomy position, prepped and draped in the usual sterile fashion.  Timeout was performed confirm the patient and procedure.  I passed the cheater from the sensor wire into the urethral meatus and it went into the urethra without resistance.  I then passed a wire which passed normally without resistance into the bladder.  It was coiled in the bladder and confirmed under fluoroscopic guidance.  The cheater was backed out.  I passed a 76 [place] female urethral dilators and then had some resistance with the return of clear yellow urine confirming placement in the bladder.  I then was able to pass the flexible cystoscope along the wire.  The flexible cystoscope went without  difficulty.  There may have been some very distal scar tissue that had been stretched.  She also had a small urethrocele with a sort of a blind-ending pouch above the mucosa at 2:00.  Its possible the catheters were getting held up..  I passed the flexible cystoscope back into the bladder and inspected the bladder carefully and the urethra on the way out.  I then passed an 89 [place] council tip over the wire.  There was some resistance but not excessive.  The balloon was inflated and seated at the bladder neck.  Catheter in good position on fluoroscopy and on palpation.  Urine was clear.  Foley connected to drainage bag and she was turned over to the care of Dr. Kipp Brood.  Complications: None  Blood loss: 0  Specimens: None  Drains: 18 French Foley catheter-Foley catheter can be removed in the typical postop fashion once patient is ambulatory.  Disposition: Patient turned over to the care of Dr. Kipp Brood for his procedure.

## 2021-10-23 ENCOUNTER — Inpatient Hospital Stay (HOSPITAL_COMMUNITY): Payer: Medicare Other

## 2021-10-23 ENCOUNTER — Encounter (HOSPITAL_COMMUNITY): Payer: Self-pay | Admitting: Thoracic Surgery (Cardiothoracic Vascular Surgery)

## 2021-10-23 LAB — CBC
HCT: 41.1 % (ref 36.0–46.0)
Hemoglobin: 13.2 g/dL (ref 12.0–15.0)
MCH: 30.9 pg (ref 26.0–34.0)
MCHC: 32.1 g/dL (ref 30.0–36.0)
MCV: 96.3 fL (ref 80.0–100.0)
Platelets: 206 10*3/uL (ref 150–400)
RBC: 4.27 MIL/uL (ref 3.87–5.11)
RDW: 12.6 % (ref 11.5–15.5)
WBC: 12.9 10*3/uL — ABNORMAL HIGH (ref 4.0–10.5)
nRBC: 0 % (ref 0.0–0.2)

## 2021-10-23 LAB — BASIC METABOLIC PANEL
Anion gap: 3 — ABNORMAL LOW (ref 5–15)
BUN: 15 mg/dL (ref 8–23)
CO2: 32 mmol/L (ref 22–32)
Calcium: 8.5 mg/dL — ABNORMAL LOW (ref 8.9–10.3)
Chloride: 104 mmol/L (ref 98–111)
Creatinine, Ser: 1.15 mg/dL — ABNORMAL HIGH (ref 0.44–1.00)
GFR, Estimated: 51 mL/min — ABNORMAL LOW (ref >60)
Glucose, Bld: 160 mg/dL — ABNORMAL HIGH (ref 70–99)
Potassium: 3.8 mmol/L (ref 3.5–5.1)
Sodium: 139 mmol/L (ref 135–145)

## 2021-10-23 LAB — GLUCOSE, CAPILLARY
Glucose-Capillary: 149 mg/dL — ABNORMAL HIGH (ref 70–99)
Glucose-Capillary: 133 mg/dL — ABNORMAL HIGH (ref 70–99)
Glucose-Capillary: 94 mg/dL (ref 70–99)
Glucose-Capillary: 124 mg/dL — ABNORMAL HIGH (ref 70–99)

## 2021-10-23 MED ORDER — MORPHINE SULFATE (PF) 2 MG/ML IV SOLN: 2.0000 mg | INTRAVENOUS | Status: DC | PRN

## 2021-10-23 MED ORDER — MORPHINE BOLUS VIA INFUSION: 2.0000 mg | INTRAVENOUS | Status: DC | PRN

## 2021-10-23 NOTE — Plan of Care (Signed)
Discussed with patient plan of care for the evening, pain management and concerns about oxygen with some teach back displayed.  Problem: Education: Goal: Knowledge of General Education information will improve Description: Including pain rating scale, medication(s)/side effects and non-pharmacologic comfort measures Outcome: Progressing

## 2021-10-23 NOTE — Progress Notes (Signed)
Mobility Specialist Progress Note    10/23/21 1529  Mobility  Activity Ambulated with assistance in hallway  Level of Assistance Contact guard assist, steadying assist  Assistive Device Front wheel walker  Distance Ambulated (ft) 350 ft  Activity Response Tolerated well  $Mobility charge 1 Mobility   Pre-Mobility: 57 HR, BP, 96% on 2L SpO2 During Mobility: 85% on RA SpO2 Post-Mobility: 59 HR, 94% on 2L SpO2  Pt received in bed and agreeable. SpO2 as low as 85% on RA. Needed 2LO2 to maintain SpO2 >/=90%. Encouraged pursed lip breathing and pulm hygiene. Returned to chair with call bell in reach and visitor present.   Hildred Alamin Mobility Specialist

## 2021-10-23 NOTE — Discharge Summary (Incomplete)
Physician Discharge Summary       [address]       [place] [address]             [phone]    Patient ID: [name] MRN: [id number] DOB/AGE: [birth year] 71 y.o.  Admit date: 10/21/2021 Discharge date: 10/23/2021  Admission Diagnoses:  Discharge Diagnoses:  Principal Problem:   Lung mass   Consults: None  Procedure (s): surgery  10/22/21 Preoperative diagnosis: Difficult Foley, urethrocele Postoperative diagnosis: Difficult Foley, urethrocele, meatal stricture   Procedure: Cystoscopy with urethral dilation and Foley catheter placement   Surgeon: Junious Silk   Anesthesia: General   10/22/2021   Patient:  Kathryn [name] Pre-Op Dx: left upper lobe pulmonary nodule   Post-op Dx:  left upper lobe NSCLC Procedure: - Robotic assisted left video thoracoscopy - left upper lobectomy - Mediastinal lymph node sampling - Intercostal nerve block   Surgeon and Role:      * [name], MD - Primary   Assistant: Evonnie Pat, PA-C    Referring: Garner Nash, DO Primary Care: Aura Dials, MD Primary Cardiologist: Minus Breeding, MD  History of Present Illness:    Kathryn [name] 71 y.o. female referred by Dr. Valeta Harms for surgical evaluation of a 2.5 cm PET avid left upper lobe pulmonary nodule.  This was found on lung cancer screening.  She does have a long history of smoking but quit 40 years ago.  She also has a history of chronic cough.  She was seen by Dr. Kipp Brood who evaluated the patient and all relevant studies and recommended proceeding with lobectomy.  She was admitted to the hospital electively on 10/21/2021 for the procedure  Hospital course: The patient was initially taken to the OR on 10/21/2021 but was found to have significant difficulty while attempting to place a Foley catheter requiring postponement of the procedure as well as obtaining a urology consult.  She was seen by Dr. Junious Silk who felt she would require preoperative  cystoscopy and urethral dilatation for Foley catheter placement.  She subsequently went to the operating room on 10/22/2021 and underwent the following procedure:  Cystoscopy diascopy with urethral dilatation and Foley catheter placement by Dr. Junious Silk.  This was followed by robotic assisted left upper lobe lobectomy with lymph node dissection by Dr. Kipp Brood.  She tolerated these procedures well and was taken to the postanesthesia care unit in stable condition.  Postoperative hospital course:  The patient has done well.  She has remained afebrile with stable vital signs.  Her blood pressure medications have been resumed.  Oxygen is being weaned and she is tolerating routine pulmonary hygiene measures.  Her Foley was removed on postoperative day #1.  Her chest tube was removed on postoperative day #1.  She has a mild postoperative reactive leukocytosis which is being monitored clinically.  She is not anemic.  Incisions are noted to be healing well without evidence of infection.  She is tolerating routine activity advancement.  Pathology has revealed***.      Latest Vital Signs: Blood pressure (!) 150/74, pulse 62, temperature 97.7 F (36.5 C), temperature source Oral, resp. rate 17, height 5\' 1"  (1.549 m), weight 114.4 kg, SpO2 94 %.  Physical Exam:***  Discharge Condition:***  Recent laboratory studies:  Lab Results  Component Value Date   WBC 12.9 (H) 10/23/2021   HGB 13.2 10/23/2021   HCT 41.1 10/23/2021   MCV 96.3 10/23/2021   PLT 206 10/23/2021   Lab Results  Component Value Date   NA 139 10/23/2021   K 3.8 10/23/2021   CL 104 10/23/2021   CO2 32 10/23/2021   CREATININE 1.15 (H) 10/23/2021   GLUCOSE 160 (H) 10/23/2021      Diagnostic Studies: DG Chest Port 1 View  Result Date: 10/23/2021 CLINICAL DATA:  Status post post lobectomy EXAM: PORTABLE CHEST 1 VIEW COMPARISON:  Chest radiograph from one day prior. FINDINGS: Stable left apical chest tube. Stable right internal  jugular central venous catheter terminating in the middle third of the SVC. Stable cardiomediastinal silhouette with mild cardiomegaly. No pneumothorax. No pleural effusion. No pulmonary edema. Mild bibasilar atelectasis, similar. IMPRESSION: 1. No pneumothorax. 2. Stable mild cardiomegaly without pulmonary edema. 3. Stable mild bibasilar atelectasis. Electronically Signed   By: Ilona Sorrel M.D.   On: 10/23/2021 08:23   DG Chest Port 1 View  Result Date: 10/22/2021 CLINICAL DATA:  LEFT upper lobectomy EXAM: PORTABLE CHEST 1 VIEW COMPARISON:  Portable exam 1555 hours compared to 10/21/2021 FINDINGS: RIGHT jugular line tip projects over SVC. New LEFT thoracostomy tube, proximal side-port extending just outside costal margin. Enlargement of cardiac silhouette with atherosclerotic calcification aorta. Subsegmental atelectasis RIGHT upper lobe with mild dependent atelectasis RIGHT base. LEFT lung grossly clear. No pleural effusion or pneumothorax. IMPRESSION: Postoperative changes as above. Please note that the proximal side-port extends just beyond the costal margin without evidence of pneumothorax. Electronically Signed   By: Lavonia Dana M.D.   On: 10/22/2021 16:58   DG C-Arm 1-60 Min-No Report  Result Date: 10/22/2021 Fluoroscopy was utilized by the requesting physician.  No radiographic interpretation.   DG CHEST PORT 1 VIEW  Result Date: 10/21/2021 CLINICAL DATA:  Central line placement EXAM: PORTABLE CHEST 1 VIEW COMPARISON:  10/19/2021 FINDINGS: Right internal jugular central line tip in the SVC 3 cm above the right atrium. No pneumothorax. Right lung is clear. Left upper lobe pulmonary mass as seen previously. IMPRESSION: Central line well positioned. No pneumothorax. Left upper lobe mass as seen previously. Electronically Signed   By: Nelson Chimes M.D.   On: 10/21/2021 11:02   DG Chest 2 View  Result Date: 10/20/2021 CLINICAL DATA:  71 year old female with preoperative chest x-ray EXAM: CHEST - 2  VIEW COMPARISON:  11/19/2004, PET-CT 09/28/2021 FINDINGS: Cardiomediastinal silhouette unchanged in size and contour. No evidence of central vascular congestion. No interlobular septal thickening. Left upper lung nodule, as demonstrated on recent PET-CT. No pneumothorax or pleural effusion. Coarsened interstitial markings, with no confluent airspace disease. No acute displaced fracture. Degenerative changes of the spine. IMPRESSION: Negative for acute cardiopulmonary disease. Left upper lobe lung nodule recently demonstrated on PET-CT. Electronically Signed   By: Corrie Mckusick D.O.   On: 10/20/2021 09:51   NM PET Image Initial (PI) Skull Base To Thigh (F-18 FDG)  Result Date: 09/29/2021 CLINICAL DATA:  Initial treatment strategy for left pulmonary nodule. EXAM: NUCLEAR MEDICINE PET SKULL BASE TO THIGH TECHNIQUE: 12.38 mCi F-18 FDG was injected intravenously. Full-ring PET imaging was performed from the skull base to thigh after the radiotracer. CT data was obtained and used for attenuation correction and anatomic localization. Fasting blood glucose: 125 mg/dl COMPARISON:  None Available. FINDINGS: Mediastinal blood pool activity: SUV max 2.82 Liver activity: SUV max NA NECK: No hypermetabolic cervical adenopathy. Hypermetabolic thickening of the right glossotonsillar sulcus on image 23/4 with a max SUV of 5.88 Incidental CT findings: Streak artifact from dental hardware. CHEST: Hypermetabolic left upper lobe pulmonary nodule measures 2.5 x 2.2 cm on  image 19/7 with a max SUV of 18.93. No hypermetabolic thoracic adenopathy. Incidental CT findings: Aortic atherosclerosis. Coronary artery calcifications. ABDOMEN/PELVIS: No abnormal hypermetabolic activity within the liver, pancreas, adrenal glands, or spleen. No hypermetabolic lymph nodes in the abdomen or pelvis. Diffuse hypermetabolic colonic uptake commonly physiologic and can be seen in the setting of such medications as metformin. Incidental CT findings:  Gallbladder surgically absent. Extensive left-sided predominant colonic diverticulosis. Hypodense simple appearing 2.8 cm left adnexal cyst is not demonstrate abnormal FDG avidity requires no independent follow-up based on size and imaging characteristics. Aortic and branch vessel atherosclerosis without abdominal aortic aneurysm. SKELETON: No focal hypermetabolic activity to suggest skeletal metastasis. Incidental CT findings: none IMPRESSION: 1. Hypermetabolic 2.5 cm left upper lobe pulmonary nodule is most consistent with a primary bronchogenic neoplasm. 2. No hypermetabolic thoracic lymph nodes or evidence of hypermetabolic distant metastatic disease in the neck, chest, abdomen or pelvis. 3. Asymmetric hypermetabolic nodular thickening of the right glossotonsillar sulcus is nonspecific, recommend correlation with direct visualization. Electronically Signed   By: Dahlia Bailiff M.D.   On: 09/29/2021 13:36   EXERCISE TOLERANCE TEST (ETT)  Result Date: 09/24/2021   Exercise stress test:  Clinically and electrically negative for ischemia   Poor exercise tolerance   Poorly trained response.         Discharge Medications: Allergies as of 10/23/2021   No Known Allergies   Med Rec must be completed prior to using this [place]***       Follow Up Appointments:  Follow-up Information     Festus Aloe, MD. Call.   Specialty: Urology Contact information: [address] [place] [address] [phone]         [name], MD Follow up.   Specialty: Cardiothoracic Surgery Why: Please see discharge paperwork for follow-up appointment with Dr. Kipp Brood.  On the date that you see Dr. Kipp Brood obtain a chest x-ray at [facility] 1/2-hour prior before to appointment.  It is located in the same office complex on the first floor. Contact information: [place] [address] [phone]                 Signed: Gaspar Bidding 10/23/2021, 1:35 PM

## 2021-10-23 NOTE — Progress Notes (Addendum)
[address]       [place] [address]             [phone]      1 Day Post-Op Procedure(s) (LRB): XI ROBOTIC ASSISTED THORASCOPY-LEFT UPPER LOBECTOMY (Left) CYSTOSCOPY, Urethral dilation, foley placement INTERCOSTAL NERVE BLOCK (Left) NODE DISSECTION (Left) Subjective: Feels some sore throat, minor pain  Objective: Vital signs in last 24 hours: Temp:  [97.3 F (36.3 C)-98.3 F (36.8 C)] 97.7 F (36.5 C) (06/09 0753) Pulse Rate:  [57-97] 65 (06/09 0753) Cardiac Rhythm: Normal sinus rhythm;Sinus bradycardia (06/09 0400) Resp:  [11-24] 16 (06/09 0753) BP: (103-146)/(40-98) 146/63 (06/09 0753) SpO2:  [91 %-100 %] 96 % (06/09 0753) Weight:  [408 kg-114.4 kg] 114.4 kg (06/09 0550)  Hemodynamic parameters for last 24 hours:    Intake/Output from previous day: 06/08 0701 - 06/09 0700 In: 890 [P.O.:640; IV Piggyback:250] Out: 698 [Urine:550; Blood:50; Chest Tube:98] Intake/Output this shift: No intake/output data recorded.  General appearance: alert, cooperative, and no distress Heart: regular rate and rhythm Lungs: clear to auscultation bilaterally Abdomen: benign Extremities: no calf tenderness Wound: incis healing well  Lab Results: No results for input(s): "WBC", "HGB", "HCT", "PLT" in the last 72 hours. BMET:  Recent Labs    10/23/21 0400  NA 139  K 3.8  CL 104  CO2 32  GLUCOSE 160*  BUN 15  CREATININE 1.15*  CALCIUM 8.5*    PT/INR: No results for input(s): "LABPROT", "INR" in the last 72 hours. ABG    Component Value Date/Time   PHART 7.5 (H) 10/19/2021 1453   HCO3 34.3 (H) 10/19/2021 1453   O2SAT 93 10/19/2021 1453   CBG (last 3)  Recent Labs    10/22/21 1512 10/22/21 2313 10/23/21 0411  GLUCAP 118* 247* 149*    Meds Scheduled Meds:  acetaminophen  1,000 mg Oral Q6H   Or   acetaminophen (TYLENOL) oral liquid 160 mg/5 mL  1,000 mg Oral Q6H   aspirin  81 mg Oral Daily   atorvastatin  10 mg Oral QHS   bisacodyl   10 mg Oral Daily   enoxaparin (LOVENOX) injection  40 mg Subcutaneous Daily   insulin aspart  0-24 Units Subcutaneous Q6H   ketorolac  15 mg Intravenous Q6H   senna-docusate  1 tablet Oral QHS   sodium chloride flush  3 mL Intravenous Q12H   triamterene-hydrochlorothiazide  1 tablet Oral Daily   verapamil  120 mg Oral BID   Continuous Infusions: PRN Meds:.ALPRAZolam, fluticasone, ondansetron (ZOFRAN) IV, traMADol  Xrays DG Chest Port 1 View  Result Date: 10/22/2021 CLINICAL DATA:  LEFT upper lobectomy EXAM: PORTABLE CHEST 1 VIEW COMPARISON:  Portable exam 1555 hours compared to 10/21/2021 FINDINGS: RIGHT jugular line tip projects over SVC. New LEFT thoracostomy tube, proximal side-port extending just outside costal margin. Enlargement of cardiac silhouette with atherosclerotic calcification aorta. Subsegmental atelectasis RIGHT upper lobe with mild dependent atelectasis RIGHT base. LEFT lung grossly clear. No pleural effusion or pneumothorax. IMPRESSION: Postoperative changes as above. Please note that the proximal side-port extends just beyond the costal margin without evidence of pneumothorax. Electronically Signed   By: Lavonia Dana M.D.   On: 10/22/2021 16:58   DG C-Arm 1-60 Min-No Report  Result Date: 10/22/2021 Fluoroscopy was utilized by the requesting physician.  No radiographic interpretation.   DG CHEST PORT 1 VIEW  Result Date: 10/21/2021 CLINICAL DATA:  Central line placement EXAM: PORTABLE CHEST 1 VIEW COMPARISON:  10/19/2021 FINDINGS: Right  internal jugular central line tip in the SVC 3 cm above the right atrium. No pneumothorax. Right lung is clear. Left upper lobe pulmonary mass as seen previously. IMPRESSION: Central line well positioned. No pneumothorax. Left upper lobe mass as seen previously. Electronically Signed   By: Nelson Chimes M.D.   On: 10/21/2021 11:02    Assessment/Plan: S/P Procedure(s) (LRB): XI ROBOTIC ASSISTED THORASCOPY-LEFT UPPER LOBECTOMY (Left) CYSTOSCOPY,  Urethral dilation, foley placement INTERCOSTAL NERVE BLOCK (Left) NODE DISSECTION (Left) POD#1 1 afeb, VSS s BP 100's-140's- resume meds 2 sats good on 4 liters 3 fair UOP- foley in place 4 CXR, some basilar atx, no pntx 5 creat slightly increased, will d/c toradol 6 no CBC- will order 7 CT- minor drainage, small air leak with cough- keep for now 8 routine pulm RX/rehab 9 d/c foley as she has walked a little 10 lovenox for DVT ppx    LOS: 2 days    John Giovanni 10/23/2021  Agree with above. We will remove chest tube today We will remove Foley today Home tomorrow.  Chauntae Hults Bary Leriche

## 2021-10-24 ENCOUNTER — Inpatient Hospital Stay (HOSPITAL_COMMUNITY): Payer: Medicare Other

## 2021-10-24 LAB — CBC
HCT: 39.1 % (ref 36.0–46.0)
Hemoglobin: 13 g/dL (ref 12.0–15.0)
MCH: 31.9 pg (ref 26.0–34.0)
MCHC: 33.2 g/dL (ref 30.0–36.0)
MCV: 95.8 fL (ref 80.0–100.0)
Platelets: 163 10*3/uL (ref 150–400)
RBC: 4.08 MIL/uL (ref 3.87–5.11)
RDW: 12.8 % (ref 11.5–15.5)
WBC: 10.1 10*3/uL (ref 4.0–10.5)
nRBC: 0 % (ref 0.0–0.2)

## 2021-10-24 LAB — COMPREHENSIVE METABOLIC PANEL
ALT: 42 U/L (ref 0–44)
AST: 41 U/L (ref 15–41)
Albumin: 3.4 g/dL — ABNORMAL LOW (ref 3.5–5.0)
Alkaline Phosphatase: 30 U/L — ABNORMAL LOW (ref 38–126)
Anion gap: 6 (ref 5–15)
BUN: 21 mg/dL (ref 8–23)
CO2: 29 mmol/L (ref 22–32)
Calcium: 8.5 mg/dL — ABNORMAL LOW (ref 8.9–10.3)
Chloride: 105 mmol/L (ref 98–111)
Creatinine, Ser: 0.88 mg/dL (ref 0.44–1.00)
GFR, Estimated: >60 mL/min (ref >60)
Glucose, Bld: 105 mg/dL — ABNORMAL HIGH (ref 70–99)
Potassium: 3.7 mmol/L (ref 3.5–5.1)
Sodium: 140 mmol/L (ref 135–145)
Total Bilirubin: 0.6 mg/dL (ref 0.3–1.2)
Total Protein: 5.8 g/dL — ABNORMAL LOW (ref 6.5–8.1)

## 2021-10-24 LAB — GLUCOSE, CAPILLARY
Glucose-Capillary: 109 mg/dL — ABNORMAL HIGH (ref 70–99)
Glucose-Capillary: 137 mg/dL — ABNORMAL HIGH (ref 70–99)

## 2021-10-24 MED ORDER — POLYETHYLENE GLYCOL 3350 17 G PO PACK
17.0000 g | PACK | Freq: Every day | ORAL | Status: DC
  Administered 2021-10-24: 17 g via ORAL
  Filled 2021-10-24: qty 1

## 2021-10-24 MED ORDER — KETOROLAC TROMETHAMINE 30 MG/ML IJ SOLN
15.0000 mg | Freq: Four times a day (QID) | INTRAMUSCULAR | Status: DC
  Administered 2021-10-24: 15 mg via INTRAVENOUS
  Filled 2021-10-24: qty 1

## 2021-10-24 MED ORDER — POTASSIUM CHLORIDE CRYS ER 10 MEQ PO TBCR
30.0000 meq | EXTENDED_RELEASE_TABLET | Freq: Once | ORAL | Status: AC
  Administered 2021-10-24: 30 meq via ORAL
  Filled 2021-10-24: qty 1

## 2021-10-24 MED ORDER — TRAMADOL HCL 50 MG PO TABS: 50.0000 mg | ORAL_TABLET | Freq: Four times a day (QID) | ORAL | 0 refills | Status: DC | PRN

## 2021-10-24 NOTE — Progress Notes (Signed)
Secure chat with Brayton Layman RN states she has removed the CVC per order.

## 2021-10-24 NOTE — Progress Notes (Signed)
Mobility Specialist Progress Note    10/24/21 1127  Mobility  Activity Ambulated with assistance in hallway  Level of Assistance Standby assist, set-up cues, supervision of patient - no hands on  Assistive Device Front wheel walker  Distance Ambulated (ft) 350 ft  Activity Response Tolerated well  $Mobility charge 1 Mobility   Pre-Mobility: 57 HR, 97% on 1L SpO2 During Mobility: 84% on RA SpO2 Post-Mobility: 64 HR, 96% on 1L SpO2  Pt received in bed and agreeable. SpO2 dropped to 84% on RA and needed 2LO2 to recover into 90s. Returned to bed with call bell in reach and husband present.   Hildred Alamin Mobility Specialist

## 2021-10-24 NOTE — TOC Transition Note (Signed)
Transition of Care [facility]) - CM/SW Discharge Note   Patient Details  Name: Kathryn [name] MRN: [id number] Date of Birth: [birth year]  [facility]) CM/SW Contact:  Tiajuana Amass Strong, [place] Phone Number: [phone] 10/24/2021, 12:17 PM   Clinical Narrative:  [facility] team for discharge planning. Call attempt to patient's room. No answer. Call to spouse- Kathryn [name]. Discussed discharge plan for home oxygen. And to expect delivery. He verbalizes understanding. He shares he will transport patient to their home and care for her. Oxygen arranged with [employer]- [phone]. Will continue to monitor for further discharge needs.     Final next level of care: Home/Self Care Barriers to Discharge: No Barriers Identified   Patient Goals and CMS Choice        Discharge Placement                       Discharge Plan and Services                DME Arranged: Oxygen DME Agency: [employer] Date DME Agency Contacted: 10/24/21 Time DME Agency Contacted: 1200 Representative spoke with at DME Agency: Delana [phone]            Social Determinants of Health ([place]) Interventions Food Insecurity Interventions: Intervention Not Indicated Housing Interventions: Intervention Not Indicated Transportation Interventions: Intervention Not Indicated   Readmission Risk Interventions     No data to display

## 2021-10-24 NOTE — Progress Notes (Addendum)
      [address]       [place] [address]             [phone]       2 Days Post-Op Procedure(s) (LRB): XI ROBOTIC ASSISTED THORASCOPY-LEFT UPPER LOBECTOMY (Left) CYSTOSCOPY, Urethral dilation, foley placement INTERCOSTAL NERVE BLOCK (Left) NODE DISSECTION (Left)  Subjective: Patient with some incisional pain this am. She is passing flatus  Objective: Vital signs in last 24 hours: Temp:  [97.6 F (36.4 C)-98.7 F (37.1 C)] 98.7 F (37.1 C) (06/10 0700) Pulse Rate:  [52-67] 62 (06/10 0700) Cardiac Rhythm: Normal sinus rhythm (06/09 1927) Resp:  [15-19] 18 (06/10 0700) BP: (125-150)/(65-79) 125/71 (06/10 0700) SpO2:  [92 %-100 %] 95 % (06/10 0700)     Intake/Output from previous day: 06/09 0701 - 06/10 0700 In: 860 [P.O.:840; I.V.:20] Out: 700 [Urine:700]   Physical Exam:  Cardiovascular: RRR. Pulmonary: Clear to auscultation bilaterally Abdomen: Soft, non tender, obese, bowel sounds present. Extremities: Mild bilateral lower extremity edema. Wounds: Clean and dry.  No erythema or signs of infection. Scant sero sanguinous drainage from left chest tube wound  Lab Results: CBC: Recent Labs    10/23/21 1104 10/24/21 0535  WBC 12.9* 10.1  HGB 13.2 13.0  HCT 41.1 39.1  PLT 206 163   BMET:  Recent Labs    10/23/21 0400 10/24/21 0535  NA 139 140  K 3.8 3.7  CL 104 105  CO2 32 29  GLUCOSE 160* 105*  BUN 15 21  CREATININE 1.15* 0.88  CALCIUM 8.5* 8.5*    PT/INR: No results for input(s): "LABPROT", "INR" in the last 72 hours. ABG:  INR: Will add last result for INR, ABG once components are confirmed Will add last 4 CBG results once components are confirmed  Assessment/Plan:  1. CV - SB at times. On Verapamil 120 mg bid and Maxzide as taken prior to surgery. 2.  Pulmonary - On 2 liters of oxygen via [place]. CXR this am appear stable. Home oxygen to be arranged. Encourage incentive spirometer. Await final pathology. 3. DM-CBGs  94/124/109. On Insulin PRN. Will restart Metformin at discharge 4. Supplement potassium 5. Miralax at patient reques 6. Discharge once CXR officially interpreted  Sharalyn Ink [facility] 10/24/2021,8:56 AM [phone]   Patient seen and examined, agree with above Home later today with home O2  Remo Lipps C. Roxan Hockey, MD [facility] [phone]

## 2021-10-24 NOTE — Progress Notes (Signed)
SATURATION QUALIFICATIONS: (This note is used to comply with regulatory documentation for home oxygen)  Patient Saturations on Room Air at Rest = 95%  Patient Saturations on Room Air while Ambulating = 84%  Patient Saturations on 2 Liters of oxygen while Ambulating = 96%

## 2021-10-26 LAB — SURGICAL PATHOLOGY

## 2021-11-03 DIAGNOSIS — Z902 Acquired absence of lung [part of]: Secondary | ICD-10-CM | POA: Diagnosis not present

## 2021-11-03 DIAGNOSIS — C3492 Malignant neoplasm of unspecified part of left bronchus or lung: Secondary | ICD-10-CM | POA: Diagnosis not present

## 2021-11-03 NOTE — Progress Notes (Unsigned)
[address]       [place], [address]             [phone]        Kathryn [name] [place] Medical Record #[id number] Date of Birth: 09-22-1950  Referring: Kathryn Dials, MD Primary Care: Kathryn Dials, MD Primary Cardiologist:Kathryn Hochrein, MD  Reason for visit:   follow-up  History of Present Illness:     ***  Physical Exam: There were no vitals taken for this visit.  Alert NAD Incision ***.  Sternum *** Abdomen, ND *** peripheral edema   Diagnostic Studies & Laboratory data: CXR: *** Path: FINAL MICROSCOPIC DIAGNOSIS:   A.   LYMPH NODE, HILAR, EXCISION:  -    Anthracotic lymph node, with no malignancy identified (0/1).   B.   LYMPH NODE, HILAR #2, EXCISION:  -    Anthracotic lymph node, with no malignancy identified (0/1).   C.   LYMPH NODE, HILAR #3, EXCISION:  -    Anthracotic lymph node, with no malignancy identified (0/1).   D.   LYMPH NODE, LEVEL 9, EXCISION:  -    Anthracotic lymph node, with no malignancy identified (0/1).   E.   LYMPH NODE, LEVEL 7, EXCISION:  -    Anthracotic lymph node, with no malignancy identified (0/1).   F.   LUNG, LEFT UPPER LOBE, LOBECTOMY:  -    Invasive squamous cell carcinoma, keratinizing (focal), grade 2  (moderately differentiated), 3.5 cm in greatest dimension, all margins  negative for invasive carcinoma.  -    Two anthracotic hilar lymph nodes, without identified malignancy  (0/2).   O  ONCOLOGY TABLE:   LUNG: Resection   Synchronous Tumors: Not applicable  Total Number of Primary Tumors: 1  Procedure: Lobectomy  Specimen Laterality: Left  Tumor Focality: Unifocal  Tumor Site: Upper lobe of lung  Tumor Size: 3.5 cm in greatest dimension       Total Tumor Size: 3.5 cm in greatest dimension       Invasive Tumor Size (applies only to invasive nonmucinous  adenocarcinoma with a lepidic            component): Not applicable  Histologic Type: Invasive squamous cell carcinoma,  keratinizing  Visceral Pleura Invasion: Present  Direct Invasion of Adjacent Structures: No adjacent structures present  Lymphovascular Invasion: Not identified  Margins: All margins negative for invasive carcinoma       Closest Margin(s) to Invasive Carcinoma: 2.4 cm (bronchial margin)       Margin(s) Involved by Invasive Carcinoma: Not applicable        Margin Status for Non-Invasive Tumor: Not applicable  Treatment Effect: No known presurgical therapy       Percentage of Residual Viable Tumor: Not applicable  Regional Lymph Nodes: Regional lymph nodes present, all regional lymph  nodes negative for tumor       Number of Lymph Nodes Involved: 0                            Nodal Sites with Tumor: Not applicable       Number of Lymph Nodes Examined: 7                       Nodal Sites Examined: Level 7, level 9, level 10  Distant Metastasis:       Distant Site(s) Involved: Not applicable  Pathologic Stage  Classification (pTNM, AJCC 8th Edition): pT2a, pN0     Assessment / Plan:   71 yo female s/p LULectomy for T2aN0M0 squamous cell cancer.   Kathryn [name] 11/03/2021 11:15 AM

## 2021-11-05 ENCOUNTER — Other Ambulatory Visit: Payer: Self-pay | Admitting: *Deleted

## 2021-11-05 ENCOUNTER — Other Ambulatory Visit: Payer: Self-pay | Admitting: Thoracic Surgery (Cardiothoracic Vascular Surgery)

## 2021-11-05 DIAGNOSIS — R918 Other nonspecific abnormal finding of lung field: Secondary | ICD-10-CM

## 2021-11-05 NOTE — Progress Notes (Signed)
The proposed treatment discussed in cancer conference is for discussion purpose only and is not a binding recommendation. The patient was not physically examined nor present for their treatment options. Therefore, final treatment plans cannot be decided.  ?

## 2021-11-06 ENCOUNTER — Encounter: Payer: Self-pay | Admitting: *Deleted

## 2021-11-06 ENCOUNTER — Telehealth: Payer: Self-pay | Admitting: Internal Medicine

## 2021-11-06 ENCOUNTER — Ambulatory Visit (INDEPENDENT_AMBULATORY_CARE_PROVIDER_SITE_OTHER): Payer: Self-pay | Admitting: Thoracic Surgery (Cardiothoracic Vascular Surgery)

## 2021-11-06 ENCOUNTER — Ambulatory Visit
Admission: RE | Admit: 2021-11-06 | Discharge: 2021-11-06 | Disposition: A | Discharge status: Home / Self Care | Payer: Medicare Other | Source: Ambulatory Visit | Attending: Thoracic Surgery (Cardiothoracic Vascular Surgery) | Admitting: Thoracic Surgery (Cardiothoracic Vascular Surgery)

## 2021-11-06 VITALS — BP 120/58 | HR 68 | Resp 20 | Ht 61.0 in | Wt 249.0 lb

## 2021-11-06 DIAGNOSIS — R918 Other nonspecific abnormal finding of lung field: Secondary | ICD-10-CM

## 2021-11-06 DIAGNOSIS — I517 Cardiomegaly: Secondary | ICD-10-CM | POA: Diagnosis not present

## 2021-11-06 DIAGNOSIS — J90 Pleural effusion, not elsewhere classified: Secondary | ICD-10-CM | POA: Diagnosis not present

## 2021-11-06 DIAGNOSIS — C3412 Malignant neoplasm of upper lobe, left bronchus or lung: Secondary | ICD-10-CM | POA: Diagnosis not present

## 2021-11-06 DIAGNOSIS — Z9889 Other specified postprocedural states: Secondary | ICD-10-CM

## 2021-11-06 DIAGNOSIS — J9811 Atelectasis: Secondary | ICD-10-CM | POA: Diagnosis not present

## 2021-11-06 DIAGNOSIS — J9601 Acute respiratory failure with hypoxia: Secondary | ICD-10-CM | POA: Diagnosis not present

## 2021-11-16 DIAGNOSIS — R948 Abnormal results of function studies of other organs and systems: Secondary | ICD-10-CM | POA: Diagnosis not present

## 2021-11-16 DIAGNOSIS — R0982 Postnasal drip: Secondary | ICD-10-CM | POA: Diagnosis not present

## 2021-11-23 DIAGNOSIS — J9601 Acute respiratory failure with hypoxia: Secondary | ICD-10-CM | POA: Diagnosis not present

## 2021-11-30 ENCOUNTER — Encounter: Payer: Self-pay | Admitting: Internal Medicine

## 2021-11-30 ENCOUNTER — Inpatient Hospital Stay: Payer: Medicare Other | Attending: Internal Medicine | Admitting: Internal Medicine

## 2021-11-30 ENCOUNTER — Other Ambulatory Visit: Payer: Self-pay

## 2021-11-30 ENCOUNTER — Inpatient Hospital Stay: Payer: Medicare Other

## 2021-11-30 VITALS — BP 141/81 | HR 80 | Temp 98.2°F | Resp 16 | Wt 239.8 lb

## 2021-11-30 DIAGNOSIS — J90 Pleural effusion, not elsewhere classified: Secondary | ICD-10-CM | POA: Diagnosis not present

## 2021-11-30 DIAGNOSIS — E119 Type 2 diabetes mellitus without complications: Secondary | ICD-10-CM

## 2021-11-30 DIAGNOSIS — I119 Hypertensive heart disease without heart failure: Secondary | ICD-10-CM | POA: Insufficient documentation

## 2021-11-30 DIAGNOSIS — C3490 Malignant neoplasm of unspecified part of unspecified bronchus or lung: Secondary | ICD-10-CM

## 2021-11-30 DIAGNOSIS — C3412 Malignant neoplasm of upper lobe, left bronchus or lung: Secondary | ICD-10-CM | POA: Diagnosis not present

## 2021-11-30 DIAGNOSIS — I10 Essential (primary) hypertension: Secondary | ICD-10-CM | POA: Diagnosis not present

## 2021-11-30 DIAGNOSIS — Z87891 Personal history of nicotine dependence: Secondary | ICD-10-CM

## 2021-11-30 DIAGNOSIS — E785 Hyperlipidemia, unspecified: Secondary | ICD-10-CM | POA: Diagnosis not present

## 2021-11-30 DIAGNOSIS — F418 Other specified anxiety disorders: Secondary | ICD-10-CM | POA: Diagnosis not present

## 2021-11-30 DIAGNOSIS — R918 Other nonspecific abnormal finding of lung field: Secondary | ICD-10-CM

## 2021-11-30 LAB — CBC WITH DIFFERENTIAL (CANCER CENTER ONLY)
Abs Immature Granulocytes: 0.02 10*3/uL (ref 0.00–0.07)
Basophils Absolute: 0 10*3/uL (ref 0.0–0.1)
Basophils Relative: 0 %
Eosinophils Absolute: 0.2 10*3/uL (ref 0.0–0.5)
Eosinophils Relative: 3 %
HCT: 43.8 % (ref 36.0–46.0)
Hemoglobin: 14.7 g/dL (ref 12.0–15.0)
Immature Granulocytes: 0 %
Lymphocytes Relative: 31 %
Lymphs Abs: 2.4 10*3/uL (ref 0.7–4.0)
MCH: 30.9 pg (ref 26.0–34.0)
MCHC: 33.6 g/dL (ref 30.0–36.0)
MCV: 92.2 fL (ref 80.0–100.0)
Monocytes Absolute: 0.6 10*3/uL (ref 0.1–1.0)
Monocytes Relative: 8 %
Neutro Abs: 4.6 10*3/uL (ref 1.7–7.7)
Neutrophils Relative %: 58 %
Platelet Count: 226 10*3/uL (ref 150–400)
RBC: 4.75 MIL/uL (ref 3.87–5.11)
RDW: 12.5 % (ref 11.5–15.5)
WBC Count: 7.9 10*3/uL (ref 4.0–10.5)
nRBC: 0 % (ref 0.0–0.2)

## 2021-11-30 LAB — CMP (CANCER CENTER ONLY)
ALT: 35 U/L (ref 0–44)
AST: 28 U/L (ref 15–41)
Albumin: 4.4 g/dL (ref 3.5–5.0)
Alkaline Phosphatase: 56 U/L (ref 38–126)
Anion gap: 4 — ABNORMAL LOW (ref 5–15)
BUN: 19 mg/dL (ref 8–23)
CO2: 34 mmol/L — ABNORMAL HIGH (ref 22–32)
Calcium: 10.2 mg/dL (ref 8.9–10.3)
Chloride: 104 mmol/L (ref 98–111)
Creatinine: 0.99 mg/dL (ref 0.44–1.00)
GFR, Estimated: >60 mL/min (ref >60)
Glucose, Bld: 141 mg/dL — ABNORMAL HIGH (ref 70–99)
Potassium: 3.7 mmol/L (ref 3.5–5.1)
Sodium: 142 mmol/L (ref 135–145)
Total Bilirubin: 0.6 mg/dL (ref 0.3–1.2)
Total Protein: 7.3 g/dL (ref 6.5–8.1)

## 2021-11-30 NOTE — Progress Notes (Signed)
[place] Telephone:([phone]) [phone]   Fax:(336) [phone]  CONSULT NOTE  REFERRING PHYSICIAN: Dr. Melodie Bouillon  REASON FOR CONSULTATION:  71 years old white female recently diagnosed with lung cancer.  HPI Kathryn [name] is a 71 y.o. female with past medical history significant for anxiety, depression, prediabetes, hypertension, dyslipidemia as well as long history of smoking.  The patient was seen by her primary care physician few months ago and because of her long history for smoking she was considered for CT screening of the chest which was performed at [facility] on 09/01/2021 and that showed suspicious 3.0 cm left upper lobe mass. The patient was referred to Dr. Valeta Harms and he ordered a PET scan which was performed on 09/28/2021 and it showed hypermetabolic 2.5 cm left upper lobe nodule with no evidence of metastatic disease to the mediastinal, hilar lymph nodes or extrathoracic metastasis. The patient was referred to Dr. Kipp Brood and on 10/22/2021 she underwent robotic assisted left upper lobectomy with lymph node sampling.  The final pathology [id number]) showed invasive squamous cell carcinoma, with focal keratinizing, grade 2 moderately differentiated measuring 0.5 cm in greatest dimension.  All the resection margins were negative for carcinoma.  There was some evidence for visceral pleural involvement but no lymphovascular invasion.  The dissected lymph nodes were negative for malignancy. The patient was referred to me today for evaluation and close monitoring of her condition. When seen today she continues to have the soreness on the left side of the chest in addition to dry cough but no significant shortness of breath or hemoptysis.  She lost around 6 pounds during her surgery recovery.  She denied nausea, vomiting, diarrhea but has constipation.  She denied having any headache or visual changes. Family history significant for mother with congestive heart  failure.  Sister had coronary artery disease and father died from suicide. The patient is married and has 3 children.  She was accompanied today by her husband Calvin.  She used to work as a Scientist, forensic.  She has a history of smoking 1 pack/day for around 50 years and quit 4.5 years ago.  She drinks alcohol occasionally no history of drug abuse.  HPI  Past Medical History:  Diagnosis Date   Anxiety    Depression    Diabetes ([place])    Dyspnea    GERD (gastroesophageal reflux disease)    HTN (hypertension)    Hyperglycemia    Pre-diabetes     Past Surgical History:  Procedure Laterality Date   CHOLECYSTECTOMY     CYSTOSCOPY  10/22/2021   Procedure: CYSTOSCOPY, Urethral dilation, foley placement;  Surgeon: Festus Aloe, MD;  Location: [facility];  Service: Urology;;   ELBOW SURGERY     INTERCOSTAL NERVE BLOCK Left 10/22/2021   Procedure: INTERCOSTAL NERVE BLOCK;  Surgeon: [name], MD;  Location: [facility] OR;  Service: Thoracic;  Laterality: Left;   NODE DISSECTION Left 10/22/2021   Procedure: NODE DISSECTION;  Surgeon: [name], MD;  Location: [facility] OR;  Service: Thoracic;  Laterality: Left;    Family History  Problem Relation Age of Onset   Heart failure Mother    Coronary artery disease Sister 71    Social History Social History   Tobacco Use   Smoking status: Former    Types: Cigarettes    Quit date: 2019    Years since quitting: 4.5   Smokeless tobacco: Never  Vaping Use   Vaping Use: Never used  Substance  Use Topics   Alcohol use: Yes    Comment: social   Drug use: Yes    Types: Marijuana    Comment: Smokes marijuana several times a week    No Known Allergies  Current Outpatient Medications  Medication Sig Dispense Refill   ALPRAZolam (XANAX) 0.25 MG tablet Take 0.25 mg by mouth daily as needed for anxiety.     aspirin 81 MG chewable tablet Chew 81 mg by mouth daily.     atorvastatin (LIPITOR) 10 MG tablet Take 10 mg by mouth daily.      Cholecalciferol (VITAMIN D3) 10 MCG (400 UNIT) CAPS Take 400 Units by mouth daily.     Fish Oil-Krill Oil (KRILL & FISH OIL BLEND PO) Take 1 capsule by mouth daily.     fluticasone (FLONASE) 50 MCG/ACT nasal spray Place 1 spray into both nostrils daily as needed for allergies.     Magnesium 250 MG TABS Take 250 mg by mouth daily.     metFORMIN (GLUCOPHAGE) 500 MG tablet Take 500 mg by mouth daily before supper.     Multiple Vitamins-Minerals (MULTIVITAMIN ADULTS 50+ PO) Take 1 tablet by mouth daily.     tiZANidine (ZANAFLEX) 2 MG tablet Take 2 mg by mouth 3 (three) times daily.     traMADol (ULTRAM) 50 MG tablet Take 1 tablet (50 mg total) by mouth every 6 (six) hours as needed (mild pain). 30 tablet 0   triamterene-hydrochlorothiazide (DYAZIDE) 37.5-25 MG capsule Take 1 capsule by mouth daily.     verapamil (CALAN-SR) 120 MG CR tablet Take 120 mg by mouth 2 (two) times daily.     No current facility-administered medications for this visit.    Review of Systems  Constitutional: positive for fatigue and weight loss Eyes: negative Ears, nose, mouth, throat, and face: negative Respiratory: positive for cough Cardiovascular: negative Gastrointestinal: positive for constipation Genitourinary:negative Integument/breast: negative Hematologic/lymphatic: negative Musculoskeletal:negative Neurological: negative Behavioral/Psych: negative Endocrine: negative Allergic/Immunologic: negative  Physical Exam  NGE:XBMWU, healthy, no distress, well nourished, and well developed SKIN: skin color, texture, turgor are normal, no rashes or significant lesions HEAD: Normocephalic, No masses, lesions, tenderness or abnormalities EYES: normal, PERRLA, Conjunctiva are pink and non-injected EARS: External ears normal, Canals clear OROPHARYNX:no exudate, no erythema, and lips, buccal mucosa, and tongue normal  NECK: supple, no adenopathy, no JVD LYMPH:  no palpable lymphadenopathy, no  hepatosplenomegaly BREAST:not examined LUNGS: clear to auscultation , and palpation HEART: regular rate & rhythm, no murmurs, and no gallops ABDOMEN:abdomen soft, non-tender, obese, normal bowel sounds, and no masses or organomegaly BACK: Back symmetric, no curvature., No CVA tenderness EXTREMITIES:no joint deformities, effusion, or inflammation, no edema  NEURO: alert & oriented x 3 with fluent speech, no focal motor/sensory deficits  PERFORMANCE STATUS: ECOG 1  LABORATORY DATA: Lab Results  Component Value Date   WBC 7.9 11/30/2021   HGB 14.7 11/30/2021   HCT 43.8 11/30/2021   MCV 92.2 11/30/2021   PLT 226 11/30/2021      Chemistry      Component Value Date/Time   NA 140 10/24/2021 0535   K 3.7 10/24/2021 0535   CL 105 10/24/2021 0535   CO2 29 10/24/2021 0535   BUN 21 10/24/2021 0535   CREATININE 0.88 10/24/2021 0535      Component Value Date/Time   CALCIUM 8.5 (L) 10/24/2021 0535   ALKPHOS 30 (L) 10/24/2021 0535   AST 41 10/24/2021 0535   ALT 42 10/24/2021 0535   BILITOT 0.6 10/24/2021 0535  RADIOGRAPHIC STUDIES: DG Chest 2 View  Result Date: 11/06/2021 CLINICAL DATA:  Lung mass. EXAM: CHEST - 2 VIEW COMPARISON:  October 24, 2021. FINDINGS: Stable cardiomegaly. Right lung is clear. Small left pleural effusion is noted with probable underlying atelectasis. Bony thorax is unremarkable. IMPRESSION: Small left pleural effusion with associated left basilar atelectasis. Electronically Signed   By: Marijo Conception M.D.   On: 11/06/2021 08:28    ASSESSMENT: This is a very pleasant 71 years old white female recently diagnosed with stage IB (T2 a, N0, M0) non-small cell lung cancer, moderately differentiated invasive squamous cell carcinoma presented with left upper lobe lung nodule status post left upper lobectomy with lymph node sampling under the care of Dr. Kipp Brood and the final pathology showed 3.5 cm squamous cell carcinoma with visceral pleural involvement diagnosed  in June 2023   PLAN: I had a lengthy discussion with the patient and her husband today about her current disease stage, prognosis and treatment options. I explained to the patient that she had curable treatment for her condition with the surgical resection. I also explained to the patient that the risk of recurrence in 5 years is around 40-45% with a survival rate of over 60%. I also explained to the patient that there is no survival benefit for adjuvant systemic chemotherapy or radiation to patient with a stage Ib non-small cell lung cancer with tumor size of less than 4.0 cm. I recommended for the patient to continue on observation with repeat CT scan of the chest in 6 months. I will see her back for follow-up visit at that time. The patient was advised to call immediately if she has any other concerning symptoms in the interval. The patient voices understanding of current disease status and treatment options and is in agreement with the current care plan.  All questions were answered. The patient knows to call the clinic with any problems, questions or concerns. We can certainly see the patient much sooner if necessary.  Thank you so much for allowing me to participate in the care of Levada Schilling. I will continue to follow up the patient with you and assist in her care.  The total time spent in the appointment was 60 minutes.  Disclaimer: This note was dictated with voice recognition software. Similar sounding words can inadvertently be transcribed and may not be corrected upon review.   Eilleen Kempf November 30, 2021, 2:01 PM

## 2021-12-06 DIAGNOSIS — J9601 Acute respiratory failure with hypoxia: Secondary | ICD-10-CM | POA: Diagnosis not present

## 2021-12-06 DIAGNOSIS — C3412 Malignant neoplasm of upper lobe, left bronchus or lung: Secondary | ICD-10-CM | POA: Diagnosis not present

## 2021-12-07 ENCOUNTER — Encounter: Payer: Self-pay | Admitting: *Deleted

## 2021-12-07 DIAGNOSIS — N35028 Other post-traumatic urethral stricture, female: Secondary | ICD-10-CM | POA: Diagnosis not present

## 2021-12-09 ENCOUNTER — Other Ambulatory Visit: Payer: Self-pay | Admitting: Thoracic Surgery (Cardiothoracic Vascular Surgery)

## 2021-12-09 DIAGNOSIS — C3412 Malignant neoplasm of upper lobe, left bronchus or lung: Secondary | ICD-10-CM

## 2021-12-10 NOTE — Progress Notes (Signed)
      [address]       [place] [address]             [phone]        Florean L Noller [place] Medical Record #[id number] Date of Birth: [birth year]  Referring: Garner Nash, DO Primary Care: Aura Dials, MD Primary Cardiologist:James Hochrein, MD  Reason for visit:   follow-up  History of Present Illness:     71 yo female presents for her 1 month f/u appointment after LULectomy.  Overall she is doing well.  She has no complaints today.  Her pain is well controlled.  Physical Exam: BP (!) 150/86   Pulse 72   Resp 20   Ht 5\' 1"  (1.549 m)   Wt 240 lb (108.9 kg)   SpO2 94%   BMI 45.35 kg/m   Alert NAD  Abdomen, ND no peripheral edema   Diagnostic Studies & Laboratory data: CXR: Small left effusion.     Assessment / Plan:   71 yo female s/p LULectomy for T2aN0M0 squamous cell cancer.  She will follow-up with medical oncology.     [name] 12/11/2021 9:35 PM

## 2021-12-11 ENCOUNTER — Ambulatory Visit (INDEPENDENT_AMBULATORY_CARE_PROVIDER_SITE_OTHER): Payer: Self-pay | Admitting: Thoracic Surgery (Cardiothoracic Vascular Surgery)

## 2021-12-11 ENCOUNTER — Ambulatory Visit
Admission: RE | Admit: 2021-12-11 | Discharge: 2021-12-11 | Disposition: A | Discharge status: Home / Self Care | Payer: Medicare Other | Source: Ambulatory Visit | Attending: Thoracic Surgery (Cardiothoracic Vascular Surgery) | Admitting: Thoracic Surgery (Cardiothoracic Vascular Surgery)

## 2021-12-11 VITALS — BP 150/86 | HR 72 | Resp 20 | Ht 61.0 in | Wt 240.0 lb

## 2021-12-11 DIAGNOSIS — C3412 Malignant neoplasm of upper lobe, left bronchus or lung: Secondary | ICD-10-CM

## 2021-12-11 DIAGNOSIS — J90 Pleural effusion, not elsewhere classified: Secondary | ICD-10-CM | POA: Diagnosis not present

## 2021-12-11 DIAGNOSIS — Z9889 Other specified postprocedural states: Secondary | ICD-10-CM

## 2021-12-11 DIAGNOSIS — C3490 Malignant neoplasm of unspecified part of unspecified bronchus or lung: Secondary | ICD-10-CM | POA: Diagnosis not present

## 2021-12-11 DIAGNOSIS — J9811 Atelectasis: Secondary | ICD-10-CM | POA: Diagnosis not present

## 2021-12-20 ENCOUNTER — Encounter: Payer: Self-pay | Admitting: Pulmonary Disease

## 2021-12-21 NOTE — Telephone Encounter (Signed)
Dr. Valeta Harms, please see mychart messages sent by pt and advise.

## 2021-12-24 DIAGNOSIS — J9601 Acute respiratory failure with hypoxia: Secondary | ICD-10-CM | POA: Diagnosis not present

## 2022-01-06 DIAGNOSIS — C3412 Malignant neoplasm of upper lobe, left bronchus or lung: Secondary | ICD-10-CM | POA: Diagnosis not present

## 2022-01-06 DIAGNOSIS — J9601 Acute respiratory failure with hypoxia: Secondary | ICD-10-CM | POA: Diagnosis not present

## 2022-01-13 ENCOUNTER — Ambulatory Visit: Payer: Medicare Other | Admitting: Pulmonary Disease

## 2022-01-13 VITALS — BP 126/64 | HR 74 | Ht 61.0 in | Wt 244.0 lb

## 2022-01-13 DIAGNOSIS — R911 Solitary pulmonary nodule: Secondary | ICD-10-CM | POA: Diagnosis not present

## 2022-01-13 DIAGNOSIS — Z720 Tobacco use: Secondary | ICD-10-CM

## 2022-01-13 DIAGNOSIS — C3412 Malignant neoplasm of upper lobe, left bronchus or lung: Secondary | ICD-10-CM

## 2022-01-13 DIAGNOSIS — Z902 Acquired absence of lung [part of]: Secondary | ICD-10-CM | POA: Diagnosis not present

## 2022-01-13 NOTE — Progress Notes (Signed)
Synopsis: Referred in May 2023 for lung nodule by Aura Dials, MD  Subjective:   PATIENT ID: Kathryn [name] GENDER: female DOB: 1951/03/14, MRN: [id number]  Chief Complaint  Patient presents with   Follow-up    Pt ishere for follow up from abnormal PET scan. Pt states that she is doing well since that last office visit.     This is a 71 year old female, past medical history of diabetes, hypertension, gastroesophageal reflux.  Patient had a lung cancer screening CT completed on 09/01/2021 which revealed a left upper lobe 30 mm pulmonary nodule concerning for malignancy.  Patient was referred here for evaluation and to discuss next steps.  She quit smoking 4 years ago.  She does however have a longstanding history of tobacco use.  She has not had any diagnosis of cancers in the past 5 years.  She does occasionally have a mildly productive cough.  She has not had any PFTs.  Never been told she had COPD.  Her cardiologist also has her planned for a stress test.  OV 10/05/2021: Here today for follow-up after Her recent nuclear medicine pet imaging.  Nuclear medicine pet imaging was completed on 09/28/2021 which revealed hypermetabolic uptake within the 2.5 cm left upper lobe pulmonary nodule consistent with a primary bronchogenic carcinoma. also there was asymmetric hypermetabolic nodular thickening of the right glossotonsillar sulcus recommending direct visualization.  There was no evidence of distant metastatic disease within the head neck Or abdomen pelvis.  From a respiratory standpoint she is breathing fine.  We reviewed her nuclear medicine pet imaging today in the office and discussed neck steps to include either biopsy and radiation or considerations for surgery.  OV 01/13/2022:Here today for follow-up regarding primary squamous cell carcinoma 2 months ago she had a left upper lobe lobectomy.  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  She is already established care with medical  oncology, seen Dr. Earlie Server on 11/30/2021 office note reviewed.  Patient has continued observation with repeat CT imaging in 6 months.  She has approximately a 40% 5-year recurrence rate.  Overall doing well from her recent surgery.  She did have a urology issue with difficulty for Foley placement prior to surgery but this was resolved during the hospitalization.      Past Medical History:  Diagnosis Date   Anxiety    Depression    Diabetes (HCC)    Dyspnea    GERD (gastroesophageal reflux disease)    HTN (hypertension)    Hyperglycemia    Pre-diabetes      Family History  Problem Relation Age of Onset   Heart failure Mother    Coronary artery disease Sister 49     Past Surgical History:  Procedure Laterality Date   CHOLECYSTECTOMY     CYSTOSCOPY  10/22/2021   Procedure: CYSTOSCOPY, Urethral dilation, foley placement;  Surgeon: Festus Aloe, MD;  Location: [place];  Service: Urology;;   ELBOW SURGERY     INTERCOSTAL NERVE BLOCK Left 10/22/2021   Procedure: INTERCOSTAL NERVE BLOCK;  Surgeon: [name], MD;  Location: [place];  Service: Thoracic;  Laterality: Left;   NODE DISSECTION Left 10/22/2021   Procedure: NODE DISSECTION;  Surgeon: [name], MD;  Location: [facility];  Service: Thoracic;  Laterality: Left;    Social History   Socioeconomic History   Marital status: Married    Spouse name: Not on file   Number of children: Not on file   Years of  education: Not on file   Highest education level: Not on file  Occupational History   Not on file  Tobacco Use   Smoking status: Former    Types: Cigarettes    Quit date: 2019    Years since quitting: 4.6   Smokeless tobacco: Never  Vaping Use   Vaping Use: Never used  Substance and Sexual Activity   Alcohol use: Yes    Comment: social   Drug use: Yes    Types: Marijuana    Comment: Smokes marijuana several times a week   Sexual activity: Not on file  Other Topics Concern   Not on file  Social  History Narrative   Lives with her husband.  Together they have six children.   Social Determinants of Health   Financial Resource Strain: Not on file  Food Insecurity: No Food Insecurity (10/24/2021)   Hunger Vital Sign    Worried About Running Out of Food in the Last Year: Never true    Ran Out of Food in the Last Year: Never true  Transportation Needs: No Transportation Needs (10/24/2021)   PRAPARE - Hydrologist (Medical): No    Lack of Transportation (Non-Medical): No  Physical Activity: Not on file  Stress: Not on file  Social Connections: Not on file  Intimate Partner Violence: Not on file     No Known Allergies   Outpatient Medications Prior to Visit  Medication Sig Dispense Refill   ALPRAZolam (XANAX) 0.25 MG tablet Take 0.25 mg by mouth daily as needed for anxiety.     aspirin 81 MG chewable tablet Chew 81 mg by mouth daily.     atorvastatin (LIPITOR) 10 MG tablet Take 10 mg by mouth daily.     Cholecalciferol (VITAMIN D3) 10 MCG (400 UNIT) CAPS Take 400 Units by mouth daily.     Fish Oil-Krill Oil (KRILL & FISH OIL BLEND PO) Take 1 capsule by mouth daily.     fluticasone (FLONASE) 50 MCG/ACT nasal spray Place 1 spray into both nostrils daily as needed for allergies.     Magnesium 250 MG TABS Take 250 mg by mouth daily.     metFORMIN (GLUCOPHAGE) 500 MG tablet Take 500 mg by mouth daily before supper.     Multiple Vitamins-Minerals (MULTIVITAMIN ADULTS 50+ PO) Take 1 tablet by mouth daily.     tiZANidine (ZANAFLEX) 2 MG tablet Take 2 mg by mouth 3 (three) times daily.     verapamil (CALAN-SR) 120 MG CR tablet Take 120 mg by mouth 2 (two) times daily.     triamterene-hydrochlorothiazide (DYAZIDE) 37.5-25 MG capsule Take 1 capsule by mouth daily.     No facility-administered medications prior to visit.    Review of Systems  Constitutional:  Negative for chills, fever, malaise/fatigue and weight loss.  HENT:  Negative for hearing loss, sore  throat and tinnitus.   Eyes:  Negative for blurred vision and double vision.  Respiratory:  Negative for cough, hemoptysis, sputum production, shortness of breath, wheezing and stridor.   Cardiovascular:  Negative for chest pain, palpitations, orthopnea, leg swelling and PND.  Gastrointestinal:  Negative for abdominal pain, constipation, diarrhea, heartburn, nausea and vomiting.  Genitourinary:  Negative for dysuria, hematuria and urgency.  Musculoskeletal:  Negative for joint pain and myalgias.  Skin:  Negative for itching and rash.  Neurological:  Negative for dizziness, tingling, weakness and headaches.  Endo/Heme/Allergies:  Negative for environmental allergies. Does not bruise/bleed easily.  Psychiatric/Behavioral:  Negative  for depression. The patient is not nervous/anxious and does not have insomnia.   All other systems reviewed and are negative.    Objective:  Physical Exam Vitals reviewed.  Constitutional:      General: She is not in acute distress.    Appearance: She is well-developed. She is obese.  HENT:     Head: Normocephalic and atraumatic.  Eyes:     General: No scleral icterus.    Conjunctiva/sclera: Conjunctivae normal.     Pupils: Pupils are equal, round, and reactive to light.  Neck:     Vascular: No JVD.     Trachea: No tracheal deviation.  Cardiovascular:     Rate and Rhythm: Normal rate and regular rhythm.     Heart sounds: Normal heart sounds. No murmur heard. Pulmonary:     Effort: Pulmonary effort is normal. No tachypnea, accessory muscle usage or respiratory distress.     Breath sounds: No stridor. No wheezing, rhonchi or rales.  Abdominal:     General: There is no distension.     Palpations: Abdomen is soft.     Tenderness: There is no abdominal tenderness.  Musculoskeletal:        General: No tenderness.     Cervical back: Neck supple.  Lymphadenopathy:     Cervical: No cervical adenopathy.  Skin:    General: Skin is warm and dry.      Capillary Refill: Capillary refill takes less than 2 seconds.     Findings: No rash.  Neurological:     Mental Status: She is alert and oriented to person, place, and time.  Psychiatric:        Behavior: Behavior normal.      Vitals:   01/13/22 1002  BP: 126/64  Pulse: 74  SpO2: 94%  Weight: 244 lb (110.7 kg)  Height: 5\' 1"  (1.549 m)    94% on RA BMI Readings from Last 3 Encounters:  01/13/22 46.10 kg/m  12/11/21 45.35 kg/m  11/30/21 45.31 kg/m   Wt Readings from Last 3 Encounters:  01/13/22 244 lb (110.7 kg)  12/11/21 240 lb (108.9 kg)  11/30/21 239 lb 12.8 oz (108.8 kg)     CBC    Component Value Date/Time   WBC 7.9 11/30/2021 1339   WBC 10.1 10/24/2021 0535   RBC 4.75 11/30/2021 1339   HGB 14.7 11/30/2021 1339   HCT 43.8 11/30/2021 1339   PLT 226 11/30/2021 1339   MCV 92.2 11/30/2021 1339   MCH 30.9 11/30/2021 1339   MCHC 33.6 11/30/2021 1339   RDW 12.5 11/30/2021 1339   LYMPHSABS 2.4 11/30/2021 1339   MONOABS 0.6 11/30/2021 1339   EOSABS 0.2 11/30/2021 1339   BASOSABS 0.0 11/30/2021 1339     Chest Imaging: April 2023 lung cancer screening CT: 30 mm left upper lobe pulmonary nodule Report reviewed in epic care everywhere completed at [facility].  Nuclear medicine pet imaging May 4580: Hypermetabolic left upper lobe pulmonary nodule concerning for primary bronchogenic carcinoma. Also hypermetabolic uptake within the posterior pharynx. The patient's images have been independently reviewed by me.    12/11/2021 chest x-ray: Stable small left pleural effusion. The patient's images have been independently reviewed by me.    Pulmonary Functions Testing Results:    Latest Ref Rng & Units 09/17/2021    9:29 AM  PFT Results  FVC-Pre L 2.05   FVC-Predicted Pre % 77   FVC-Post L 2.05   FVC-Predicted Post % 77   Pre FEV1/FVC % % 77  Post FEV1/FCV % % 74   FEV1-Pre L 1.58   FEV1-Predicted Pre % 79   FEV1-Post L 1.52   DLCO uncorrected ml/min/mmHg  18.12   DLCO UNC% % 103   DLCO corrected ml/min/mmHg 17.15   DLCO COR %Predicted % 97   DLVA Predicted % 110     FeNO:   Pathology:   Echocardiogram:   Heart Catheterization:     Assessment & Plan:     ICD-10-CM   1. 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 Left upper lobe pulmonary nodule  R91.1     3. Tobacco abuse  Z72.0     4. S/P robotic assisted lobectomy of lung  Z90.2    Dr. Kipp Brood       Discussion:  71 year old female left upper lobe pulmonary nodule diagnosis squamous cell carcinoma status post robotic assisted lobectomy by Dr. Kipp Brood.  Plan: Doing well post surgery. She was discharged with oxygen. No longer using oxygen during the day O2 sats that she monitors at home above 94%. I do think she probably benefit from an overnight pulse oximetry reading before discontinuation of her oxygen. She does have body habitus with a BMI of 46 concerning for OSA. If she does have nocturnal desaturations could consider home sleep study. We will get her set up with her ONO follow-up to see [place]. Return to clinic to see me or SG, NP in 6 months.    Current Outpatient Medications:    ALPRAZolam (XANAX) 0.25 MG tablet, Take 0.25 mg by mouth daily as needed for anxiety., Disp: , Rfl:    aspirin 81 MG chewable tablet, Chew 81 mg by mouth daily., Disp: , Rfl:    atorvastatin (LIPITOR) 10 MG tablet, Take 10 mg by mouth daily., Disp: , Rfl:    Cholecalciferol (VITAMIN D3) 10 MCG (400 UNIT) CAPS, Take 400 Units by mouth daily., Disp: , Rfl:    Fish Oil-Krill Oil (KRILL & FISH OIL BLEND PO), Take 1 capsule by mouth daily., Disp: , Rfl:    fluticasone (FLONASE) 50 MCG/ACT nasal spray, Place 1 spray into both nostrils daily as needed for allergies., Disp: , Rfl:    Magnesium 250 MG TABS, Take 250 mg by mouth daily., Disp: , Rfl:    metFORMIN (GLUCOPHAGE) 500 MG tablet, Take 500 mg by mouth daily before supper., Disp: , Rfl:    Multiple  Vitamins-Minerals (MULTIVITAMIN ADULTS 50+ PO), Take 1 tablet by mouth daily., Disp: , Rfl:    tiZANidine (ZANAFLEX) 2 MG tablet, Take 2 mg by mouth 3 (three) times daily., Disp: , Rfl:    verapamil (CALAN-SR) 120 MG CR tablet, Take 120 mg by mouth 2 (two) times daily., Disp: , Rfl:   Garner Nash, DO [facility] 01/13/2022 10:29 AM

## 2022-01-13 NOTE — Patient Instructions (Signed)
Thank you for visiting Dr. Valeta Harms at [facility]. Today we recommend the following:  Overnight Pulse Oximetry  We will let you know whether or not you need O2 at night.  Return in about 6 months (around 07/15/2022) for with Eric Form, NP, or Dr. Valeta Harms.    Please do your part to reduce the spread of COVID-19.

## 2022-01-24 DIAGNOSIS — J9601 Acute respiratory failure with hypoxia: Secondary | ICD-10-CM | POA: Diagnosis not present

## 2022-01-26 DIAGNOSIS — E785 Hyperlipidemia, unspecified: Secondary | ICD-10-CM | POA: Diagnosis not present

## 2022-01-26 DIAGNOSIS — Z902 Acquired absence of lung [part of]: Secondary | ICD-10-CM | POA: Diagnosis not present

## 2022-01-26 DIAGNOSIS — K760 Fatty (change of) liver, not elsewhere classified: Secondary | ICD-10-CM | POA: Diagnosis not present

## 2022-01-26 DIAGNOSIS — L309 Dermatitis, unspecified: Secondary | ICD-10-CM | POA: Diagnosis not present

## 2022-01-26 DIAGNOSIS — Z23 Encounter for immunization: Secondary | ICD-10-CM | POA: Diagnosis not present

## 2022-01-26 DIAGNOSIS — I10 Essential (primary) hypertension: Secondary | ICD-10-CM | POA: Diagnosis not present

## 2022-01-26 DIAGNOSIS — E1169 Type 2 diabetes mellitus with other specified complication: Secondary | ICD-10-CM | POA: Diagnosis not present

## 2022-01-27 ENCOUNTER — Telehealth: Payer: Self-pay | Admitting: Pulmonary Disease

## 2022-02-01 NOTE — Telephone Encounter (Signed)
Order has been sent to [facility]. Nothing further needed at this time

## 2022-02-01 NOTE — Telephone Encounter (Signed)
Can we send ONO order to [employer] not [employer] please.   Thank you

## 2022-02-06 DIAGNOSIS — C3412 Malignant neoplasm of upper lobe, left bronchus or lung: Secondary | ICD-10-CM | POA: Diagnosis not present

## 2022-02-06 DIAGNOSIS — J9601 Acute respiratory failure with hypoxia: Secondary | ICD-10-CM | POA: Diagnosis not present

## 2022-02-10 DIAGNOSIS — R0683 Snoring: Secondary | ICD-10-CM | POA: Diagnosis not present

## 2022-02-10 DIAGNOSIS — G4730 Sleep apnea, unspecified: Secondary | ICD-10-CM | POA: Diagnosis not present

## 2022-02-23 DIAGNOSIS — J9601 Acute respiratory failure with hypoxia: Secondary | ICD-10-CM | POA: Diagnosis not present

## 2022-03-01 ENCOUNTER — Telehealth: Payer: Self-pay | Admitting: Pulmonary Disease

## 2022-03-01 DIAGNOSIS — C3412 Malignant neoplasm of upper lobe, left bronchus or lung: Secondary | ICD-10-CM

## 2022-03-01 NOTE — Telephone Encounter (Signed)
ONO completed 02/10/22. Pt had 3 hours 58 minutes where O2 sats were less than or equal to 88%. Per Dr. Valeta Harms, have patient start O2 at 2L and then have patient repeat the ONO on 2L so we can make sure that is going to be enough O2 for pt.  Called and spoke with pt letting her know the results of the ONO and stated the recs per BI. Pt verbalized understanding. Pt already has O2 at home and knows to go ahead and wear her O2 at night. Pt also knows that we are repeating the ONO on the O2. Order placed. Nothing further needed.

## 2022-03-08 DIAGNOSIS — J9601 Acute respiratory failure with hypoxia: Secondary | ICD-10-CM | POA: Diagnosis not present

## 2022-03-08 DIAGNOSIS — C3412 Malignant neoplasm of upper lobe, left bronchus or lung: Secondary | ICD-10-CM | POA: Diagnosis not present

## 2022-03-09 ENCOUNTER — Encounter: Payer: Self-pay | Admitting: Pulmonary Disease

## 2022-03-17 DIAGNOSIS — G4730 Sleep apnea, unspecified: Secondary | ICD-10-CM | POA: Diagnosis not present

## 2022-03-17 DIAGNOSIS — R0683 Snoring: Secondary | ICD-10-CM | POA: Diagnosis not present

## 2022-03-18 ENCOUNTER — Telehealth: Payer: Self-pay | Admitting: Pulmonary Disease

## 2022-03-18 NOTE — Telephone Encounter (Signed)
Fax received from Dr. Ronnette Juniper with [facility] to perform a COLONOSCOPY on patient.  Patient needs surgery clearance. Surgery is Pending. Patient was seen on 01/13/2022. Office protocol is a risk assessment can be sent to surgeon if patient has been seen in 60 days or less.   Sending to Dr Valeta Harms for risk assessment or recommendations if patient needs to be seen in office prior to surgical procedure.    Maricela Bo is needing to know if patient can have the procedure at the surgery center or the hospital.

## 2022-03-19 NOTE — Telephone Encounter (Signed)
OV notes and clearance form have been faxed back to [facility]. Nothing further needed at this time.

## 2022-03-26 DIAGNOSIS — J9601 Acute respiratory failure with hypoxia: Secondary | ICD-10-CM | POA: Diagnosis not present

## 2022-04-01 ENCOUNTER — Telehealth: Payer: Self-pay | Admitting: Pulmonary Disease

## 2022-04-02 ENCOUNTER — Encounter: Payer: Self-pay | Admitting: *Deleted

## 2022-04-02 NOTE — Telephone Encounter (Signed)
Called and spoke with pt who states that she wears her O2 at night and states that she is getting ready to go on a trip to [place] in December. Due to this, she is needing a letter for the flight so she can be able to take her O2 with her. Stated to pt that we will write a letter for her to come by and pick up. Pt said that she only has the home concentrator and I stated to her that she would need to contact DME to see if they have a different O2 unit that they could loan her to take with her on her trip and she verbalized understanding. Letter written. Nothing further needed.

## 2022-04-07 ENCOUNTER — Other Ambulatory Visit: Payer: Self-pay | Admitting: Gastroenterology

## 2022-04-25 DIAGNOSIS — J9601 Acute respiratory failure with hypoxia: Secondary | ICD-10-CM | POA: Diagnosis not present

## 2022-05-24 ENCOUNTER — Other Ambulatory Visit: Payer: Self-pay | Admitting: *Deleted

## 2022-05-26 ENCOUNTER — Encounter (HOSPITAL_COMMUNITY): Payer: Self-pay | Admitting: Gastroenterology

## 2022-06-01 ENCOUNTER — Ambulatory Visit (HOSPITAL_COMMUNITY): Payer: Medicare Other | Admitting: Certified Registered Nurse Anesthetist

## 2022-06-01 ENCOUNTER — Ambulatory Visit (HOSPITAL_BASED_OUTPATIENT_CLINIC_OR_DEPARTMENT_OTHER): Payer: Medicare Other | Admitting: Certified Registered Nurse Anesthetist

## 2022-06-01 ENCOUNTER — Encounter (HOSPITAL_COMMUNITY): Payer: Self-pay | Admitting: Gastroenterology

## 2022-06-01 ENCOUNTER — Ambulatory Visit (HOSPITAL_COMMUNITY)
Admission: RE | Admit: 2022-06-01 | Discharge: 2022-06-01 | Disposition: A | Discharge status: Home / Self Care | Payer: Medicare Other | Attending: Gastroenterology | Admitting: Gastroenterology

## 2022-06-01 ENCOUNTER — Encounter (HOSPITAL_COMMUNITY)
Admission: RE | Disposition: A | Discharge status: Home / Self Care | Payer: Self-pay | Source: Home / Self Care | Attending: Gastroenterology

## 2022-06-01 DIAGNOSIS — K635 Polyp of colon: Secondary | ICD-10-CM

## 2022-06-01 DIAGNOSIS — Z902 Acquired absence of lung [part of]: Secondary | ICD-10-CM | POA: Insufficient documentation

## 2022-06-01 DIAGNOSIS — Z6841 Body Mass Index (BMI) 40.0 and over, adult: Secondary | ICD-10-CM | POA: Insufficient documentation

## 2022-06-01 DIAGNOSIS — Z7984 Long term (current) use of oral hypoglycemic drugs: Secondary | ICD-10-CM | POA: Insufficient documentation

## 2022-06-01 DIAGNOSIS — I10 Essential (primary) hypertension: Secondary | ICD-10-CM | POA: Diagnosis not present

## 2022-06-01 DIAGNOSIS — Z9981 Dependence on supplemental oxygen: Secondary | ICD-10-CM | POA: Insufficient documentation

## 2022-06-01 DIAGNOSIS — K219 Gastro-esophageal reflux disease without esophagitis: Secondary | ICD-10-CM | POA: Diagnosis not present

## 2022-06-01 DIAGNOSIS — D175 Benign lipomatous neoplasm of intra-abdominal organs: Secondary | ICD-10-CM | POA: Insufficient documentation

## 2022-06-01 DIAGNOSIS — K621 Rectal polyp: Secondary | ICD-10-CM | POA: Diagnosis not present

## 2022-06-01 DIAGNOSIS — F32A Depression, unspecified: Secondary | ICD-10-CM | POA: Insufficient documentation

## 2022-06-01 DIAGNOSIS — K648 Other hemorrhoids: Secondary | ICD-10-CM | POA: Insufficient documentation

## 2022-06-01 DIAGNOSIS — Z86010 Personal history of colonic polyps: Secondary | ICD-10-CM

## 2022-06-01 DIAGNOSIS — E119 Type 2 diabetes mellitus without complications: Secondary | ICD-10-CM | POA: Insufficient documentation

## 2022-06-01 DIAGNOSIS — Z87891 Personal history of nicotine dependence: Secondary | ICD-10-CM

## 2022-06-01 DIAGNOSIS — F419 Anxiety disorder, unspecified: Secondary | ICD-10-CM | POA: Insufficient documentation

## 2022-06-01 DIAGNOSIS — K5730 Diverticulosis of large intestine without perforation or abscess without bleeding: Secondary | ICD-10-CM | POA: Insufficient documentation

## 2022-06-01 DIAGNOSIS — Z1211 Encounter for screening for malignant neoplasm of colon: Secondary | ICD-10-CM | POA: Diagnosis not present

## 2022-06-01 HISTORY — PX: POLYPECTOMY: SHX5525

## 2022-06-01 HISTORY — PX: COLONOSCOPY WITH PROPOFOL: SHX5780

## 2022-06-01 LAB — GLUCOSE, CAPILLARY: Glucose-Capillary: 125 mg/dL — ABNORMAL HIGH (ref 70–99)

## 2022-06-01 SURGERY — COLONOSCOPY WITH PROPOFOL
Anesthesia: Monitor Anesthesia Care

## 2022-06-01 MED ORDER — SODIUM CHLORIDE 0.9 % IV SOLN: INTRAVENOUS | Status: DC

## 2022-06-01 MED ORDER — LACTATED RINGERS IV SOLN: INTRAVENOUS | Status: DC | PRN

## 2022-06-01 MED ORDER — PROPOFOL 10 MG/ML IV BOLUS
INTRAVENOUS | Status: DC | PRN
  Administered 2022-06-01 (×2): 20 mg via INTRAVENOUS

## 2022-06-01 MED ORDER — PROPOFOL 500 MG/50ML IV EMUL
INTRAVENOUS | Status: DC | PRN
  Administered 2022-06-01: 100 ug/kg/min via INTRAVENOUS

## 2022-06-01 SURGICAL SUPPLY — 22 items

## 2022-06-01 NOTE — Discharge Instructions (Signed)
YOU HAD AN ENDOSCOPIC PROCEDURE TODAY: Refer to the procedure report and other information in the discharge instructions given to you for any specific questions about what was found during the examination. If this information does not answer your questions, please call [facility] office at [phone] to clarify.   YOU SHOULD EXPECT: Some feelings of bloating in the abdomen. Passage of more gas than usual. Walking can help get rid of the air that was put into your GI tract during the procedure and reduce the bloating. If you had a lower endoscopy (such as a colonoscopy or flexible sigmoidoscopy) you may notice spotting of blood in your stool or on the toilet paper. Some abdominal soreness may be present for a day or two, also.  DIET: Your first meal following the procedure should be a light meal and then it is ok to progress to your normal diet. A half-sandwich or bowl of soup is an example of a good first meal. Heavy or fried foods are harder to digest and may make you feel nauseous or bloated. Drink plenty of fluids but you should avoid alcoholic beverages for 24 hours. If you had a esophageal dilation, please see attached instructions for diet.    ACTIVITY: Your care partner should take you home directly after the procedure. You should plan to take it easy, moving slowly for the rest of the day. You can resume normal activity the day after the procedure however YOU SHOULD NOT DRIVE, use power tools, machinery or perform tasks that involve climbing or major physical exertion for 24 hours (because of the sedation medicines used during the test).   SYMPTOMS TO REPORT IMMEDIATELY: A gastroenterologist can be reached at any hour. Please call [phone]  for any of the following symptoms:  . Following lower endoscopy (colonoscopy, flexible sigmoidoscopy) Excessive amounts of blood in the stool  Significant tenderness, worsening of abdominal pains  Swelling of the abdomen that is new, acute  Fever of 100  or higher  . Following upper endoscopy (EGD, EUS, ERCP, esophageal dilation) Vomiting of blood or coffee ground material  New, significant abdominal pain  New, significant chest pain or pain under the shoulder blades  Painful or persistently difficult swallowing  New shortness of breath  Black, tarry-looking or red, bloody stools  FOLLOW UP:  If any biopsies were taken you will be contacted by phone or by letter within the next 1-3 weeks. Call [phone]  if you have not heard about the biopsies in 3 weeks.  Please also call with any specific questions about appointments or follow up tests. YOU HAD AN ENDOSCOPIC PROCEDURE TODAY: Refer to the procedure report and other information in the discharge instructions given to you for any specific questions about what was found during the examination. If this information does not answer your questions, please call [facility] office at [phone] to clarify.   YOU SHOULD EXPECT: Some feelings of bloating in the abdomen. Passage of more gas than usual. Walking can help get rid of the air that was put into your GI tract during the procedure and reduce the bloating. If you had a lower endoscopy (such as a colonoscopy or flexible sigmoidoscopy) you may notice spotting of blood in your stool or on the toilet paper. Some abdominal soreness may be present for a day or two, also.  DIET: Your first meal following the procedure should be a light meal and then it is ok to progress to your normal diet. A half-sandwich or bowl of soup   is an example of a good first meal. Heavy or fried foods are harder to digest and may make you feel nauseous or bloated. Drink plenty of fluids but you should avoid alcoholic beverages for 24 hours. If you had a esophageal dilation, please see attached instructions for diet.    ACTIVITY: Your care partner should take you home directly after the procedure. You should plan to take it easy, moving slowly for the rest of the day. You can  resume normal activity the day after the procedure however YOU SHOULD NOT DRIVE, use power tools, machinery or perform tasks that involve climbing or major physical exertion for 24 hours (because of the sedation medicines used during the test).   SYMPTOMS TO REPORT IMMEDIATELY: A gastroenterologist can be reached at any hour. Please call [phone]  for any of the following symptoms:  . Following lower endoscopy (colonoscopy, flexible sigmoidoscopy) Excessive amounts of blood in the stool  Significant tenderness, worsening of abdominal pains  Swelling of the abdomen that is new, acute  Fever of 100 or higher  . Following upper endoscopy (EGD, EUS, ERCP, esophageal dilation) Vomiting of blood or coffee ground material  New, significant abdominal pain  New, significant chest pain or pain under the shoulder blades  Painful or persistently difficult swallowing  New shortness of breath  Black, tarry-looking or red, bloody stools  FOLLOW UP:  If any biopsies were taken you will be contacted by phone or by letter within the next 1-3 weeks. Call [phone]  if you have not heard about the biopsies in 3 weeks.  Please also call with any specific questions about appointments or follow up tests.  

## 2022-06-01 NOTE — Anesthesia Procedure Notes (Signed)
Procedure Name: MAC Date/Time: 06/01/2022 10:07 AM  Performed by: West Pugh, CRNAPre-anesthesia Checklist: Patient identified, Emergency Drugs available, Suction available, Patient being monitored and Timeout performed Patient Re-evaluated:Patient Re-evaluated prior to induction Oxygen Delivery Method: Simple face mask Placement Confirmation: positive ETCO2

## 2022-06-01 NOTE — Transfer of Care (Signed)
Immediate Anesthesia Transfer of Care Note  Patient: Kathryn [name]  Procedure(s) Performed: COLONOSCOPY WITH PROPOFOL POLYPECTOMY  Patient Location: PACU and Endoscopy Unit  Anesthesia Type:MAC  Level of Consciousness: awake, alert , and patient cooperative  Airway & Oxygen Therapy: Patient Spontanous Breathing and Patient connected to face mask oxygen  Post-op Assessment: Report given to RN and Post -op Vital signs reviewed and stable  Post vital signs: Reviewed and stable  Last Vitals:  Vitals Value Taken Time  BP    Temp    Pulse 75 06/01/22 1048  Resp 20 06/01/22 1048  SpO2 98 % 06/01/22 1048  Vitals shown include unvalidated device data.  Last Pain:  Vitals:   06/01/22 0937  TempSrc: Temporal      Patients Stated Pain Goal: 3 (15/72/62 0355)  Complications: No notable events documented.

## 2022-06-01 NOTE — Op Note (Signed)
[facility] Patient Name: Kathryn [name] Procedure Date: 06/01/2022 MRN: [id number] Attending MD: Kerin Salen , MD, [id number] Date of Birth: January 26, 1951 CSN: [id number] Age: 72 Admit Type: Outpatient Procedure:                Colonoscopy Indications:              High risk colon cancer surveillance: Personal                            history of adenoma with villous component, Last                            colonoscopy: 2019 Providers:                Kerin Salen, MD, Lorenza Evangelist, RN, Marja Kays, Technician Referring MD:             Tera Helper PA Medicines:                Monitored Anesthesia Care Complications:            No immediate complications. Estimated blood loss:                            Minimal. Estimated Blood Loss:     Estimated blood loss was minimal. Procedure:                Pre-Anesthesia Assessment:                           - Prior to the procedure, a History and Physical                            was performed, and patient medications and                            allergies were reviewed. The patient's tolerance of                            previous anesthesia was also reviewed. The risks                            and benefits of the procedure and the sedation                            options and risks were discussed with the patient.                            All questions were answered, and informed consent                            was obtained. Prior Anticoagulants: The patient has                            taken no anticoagulant or  antiplatelet agents                            except for aspirin. ASA Grade Assessment: III - A                            patient with severe systemic disease. After                            reviewing the risks and benefits, the patient was                            deemed in satisfactory condition to undergo the                            procedure.                            After obtaining informed consent, the colonoscope                            was passed under direct vision. Throughout the                            procedure, the patient's blood pressure, pulse, and                            oxygen saturations were monitored continuously. The                            PCF-HQ190L ([id number]) Olympus colonoscope was                            introduced through the anus and advanced to the the                            terminal ileum. The colonoscopy was performed                            without difficulty. The patient tolerated the                            procedure well. The quality of the bowel                            preparation was adequate to identify polyps greater                            than 5 mm in size. The terminal ileum, ileocecal                            valve, appendiceal orifice, and rectum were  photographed. Scope In: 10:17:40 AM Scope Out: 10:40:29 AM Scope Withdrawal Time: 0 hours 19 minutes 31 seconds  Total Procedure Duration: 0 hours 22 minutes 49 seconds  Findings:      The perianal and digital rectal examinations were normal.      The terminal ileum appeared normal.      Six sessile polyps were found in the rectum and sigmoid colon. The       polyps were 5 to 8 mm in size. These polyps were removed with a hot       snare. Resection and retrieval were complete.      Multiple large-mouthed, medium-mouthed and small-mouthed diverticula       were found in the sigmoid colon and descending colon.      There was a medium-sized lipoma, in the transverse colon.      Non-bleeding internal hemorrhoids were found during retroflexion. Impression:               - The examined portion of the ileum was normal.                           - Six 5 to 8 mm polyps in the rectum and in the                            sigmoid colon, removed with a hot snare. Resected                            and  retrieved.                           - Diverticulosis in the sigmoid colon and in the                            descending colon.                           - Medium-sized lipoma in the transverse colon.                           - Non-bleeding internal hemorrhoids. Moderate Sedation:      Patient did not receive moderate sedation for this procedure, but       instead received monitored anesthesia care. Recommendation:           - Patient has a contact number available for                            emergencies. The signs and symptoms of potential                            delayed complications were discussed with the                            patient. Return to normal activities tomorrow.                            Written discharge instructions were provided to the  patient.                           - High fiber diet.                           - Continue present medications.                           - Await pathology results.                           - Repeat colonoscopy for surveillance based on                            pathology results. Procedure Code(s):        --- Professional ---                           [id number], Colonoscopy, flexible; with removal of                            tumor(s), polyp(s), or other lesion(s) by snare                            technique Diagnosis Code(s):        --- Professional ---                           Z86.010, Personal history of colonic polyps                           K64.8, Other hemorrhoids                           D12.8, Benign neoplasm of rectum                           D12.5, Benign neoplasm of sigmoid colon                           D17.5, Benign lipomatous neoplasm of                            intra-abdominal organs                           K57.30, Diverticulosis of large intestine without                            perforation or abscess without bleeding CPT copyright 2022 American Medical  Association. All rights reserved. The codes documented in this report are preliminary and upon coder review may  be revised to meet current compliance requirements. Kerin Salen, MD 06/01/2022 10:45:27 AM This report has been signed electronically. Number of Addenda: 0

## 2022-06-01 NOTE — Anesthesia Preprocedure Evaluation (Signed)
Anesthesia Evaluation  Patient identified by MRN, date of birth, ID band Patient awake    Reviewed: Allergy & Precautions, H&P , NPO status , Patient's Chart, lab work & pertinent test results  Airway Mallampati: II   Neck ROM: full    Dental   Pulmonary shortness of breath, former smoker H/o lobectomy.  Pt on 2L [place] at night.   breath sounds clear to auscultation       Cardiovascular hypertension,  Rhythm:regular Rate:Normal     Neuro/Psych  PSYCHIATRIC DISORDERS Anxiety Depression       GI/Hepatic ,GERD  ,,  Endo/Other  diabetes, Type 2  Morbid obesity  Renal/GU      Musculoskeletal   Abdominal   Peds  Hematology   Anesthesia Other Findings   Reproductive/Obstetrics                             Anesthesia Physical Anesthesia Plan  ASA: 3  Anesthesia Plan: MAC   Post-op Pain Management:    Induction: Intravenous  PONV Risk Score and Plan: 2 and Propofol infusion and Treatment may vary due to age or medical condition  Airway Management Planned: Nasal Cannula  Additional Equipment:   Intra-op Plan:   Post-operative Plan:   Informed Consent: I have reviewed the patients History and Physical, chart, labs and discussed the procedure including the risks, benefits and alternatives for the proposed anesthesia with the patient or authorized representative who has indicated his/her understanding and acceptance.     Dental advisory given  Plan Discussed with: CRNA, Anesthesiologist and Surgeon  Anesthesia Plan Comments:        Anesthesia Quick Evaluation

## 2022-06-01 NOTE — H&P (Signed)
History of Present Illness General:        72 year old female, established patient of  presents to schedule repeat colonoscopy.        Last colonoscopy, Dr. Therisa Doyne 06/2018 showed 1 (76mm) tubulovillous adenoma, 3 (57mm - 32mm) tubular adenomas, and 5 hyperplastic polyps removed. 3-year repeat colonoscopy was recommended.       She admits to mild chronic constipation since her lung surgery 4 months ago. Currently taking stool softener. Tried MiraLAX for a few days. Constipation is a little improved. Still has large hard stools with mild straining. She denies rectal bleeding, abdominal pain, or weight loss.       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 Underwent left upper lobectomy 10/2021. Diagnosed with T2 aN0 M0 squamous cell carcinoma. Oncologist Dr. Earlie Server. Has Not required chemotherapy. Recently saw her pulmonologist, Dr. Valeta Harms 01/13/2022. Doing well from surgery. Has monitoring CT every 6 months. Quit smoking in 2019. Takes 81 mg aspirin daily. No other blood thinners. History of GERD, hypertension, diabetes, and morbid obesity (BMI 45). She uses oxygen at nighttime but not during the day.   Current MedicationsTakingALPRAZolam 0.25 MG Tablet 1/2-1 tablet as needed Orally Twice a dayAtorvastatin Calcium 10 MG Tablet 1 tablet Orally Once a dayFluocinolone Acetonide 0.01 % Solution 5 drops In bilateral ears Twice a daymetFORMIN HCl 500 MG Tablet 1 tablet with a meal Orally Once a daytraZODone HCl 50 MG Tablet TAKE 1/2 TO 1 TABLET BY MOUTH AT BEDTIME AS NEEDEDTriamterene-HCTZ 37.5-25 MG Capsule 1 capsule in the morning Orally Once a dayVerapamil HCl ER 120 MG Tablet Extended Release 1 tablet Orally twice a dayMagnesium 400 MG Capsule as directed OrallyAspirin 81 MG Tablet 1 tablet Orally Once a dayKrill Oil 1000 MG Capsule OrallyMultivitamin Adult(Multiple Vitamins-Minerals) - Tablet OrallyStool Softener(Docusate Sodium) 100 MG Capsule 1 capsule  as needed Orally Once a dayVitamin D3 125 MCG (5000 UT) Capsule as directed OrallyFlonase Allergy Relief(Fluticasone Propionate) 50 MCG/ACT Suspension 1 spray in each nostril Nasally Once a day , Notes to Pharmacist: as neededNot-TakingtraMADol HCl 50 MG Tablet 1 tablet as needed Orally Up to 3 times a day as neededtiZANidine HCl 2 MG Tablet TAKE 1 TABLET BY MOUTH UP TO 3 TIMES A DAY AS NEEDED 30Triamcinolone Acetonide 0.5 % Cream 1 application Externally Twice a day as directedMedication List reviewed and reconciled with the patientPast Medical History     HTN.     Depression/ anxiety.     GERD.     Obesity.     Diabetes, Type II.     Vit. D Deficiency.     Hyperglycemia.     Fatty liver.Surgical History      Right tennis elbow surgery 07/06      Choecystectomy 1976      Colonoscopy 07/10/2018      thorascopy -left upper lobectomy- .Dr Kipp Brood 6/2023Family HistoryFather: deceased 69 yrs, suicide, alcoholism and depressionMother: deceased 54 yrs, Congestive heart failure, CAD, HTNSister 1: alive 1 yrs, skin cancerSister 2: alive 58 yrs, A + WSpouse: alive 38 yrs, A + WChildren: alive, A + WNo known family hx of colon cancer, polyps, or liver disease.Social HistoryGeneral: Tobacco use     cigarettes:  Former smoker    Quit in year  2019    Tobacco history last updated  10/30/2023EXPOSURE TO PASSIVE SMOKE: yes.Alcohol: yes, couple of glasses per wine - a few times per week.Caffeine: 2 cups of coffee daily and 1-2 soda a week.Recreational drug  use: yes, Marijuana in the past, currently taking CBD's.DIET: no.Exercise: no.Marital Status: Married 2nd marriage.Children: 3 adult children.OCCUPATION: Print production planner.Seat belt use: yes.Gyn HistoryBirth control husband s/p vasectomy. Last mammogram date 09/2000.. AllergiesN.K.D.A.Hospitalization/Major Diagnostic ProcedureLobectomy surgery 06/2023Review of SystemsGI PROCEDURE:        Pacemaker/ AICD no.  Artificial heart valves no.  MI/heart attack no.  Abnormal heart rhythm  no.  Angina no.  CVA no.  Hypertension YES        .  Hypotension no.  Asthma, COPD no.  Sleep apnea no.  Seizure disorders no.  Artificial joints no.  Diabetes no.  Significant headaches no.  Vertigo no.  Depression/anxiety no.  Abnormal bleeding no.  Kidney Disease no.  Liver disease YES        .  Blood transfusion no.  Vital SignsWt: 243.8, Wt change: -0.6 lbs, Ht: 61.25, BMI: 45.69, Temp: 98.2, Pulse sitting: 78, BP sitting: 141/82Pt did not want me to retake BP.ExaminationGastroenterology    Exam:       GENERAL APPEARANCE: Well developed, obese, no active distress, pleasant, no acute distress.        SCLERA: anicteric.        RESPIRATORY Breath sounds clear to auscultation. No wheezes, rales or rhonchi. Respiration even and mildly labored; Mild short of breath with talking.  Pulse Ox is between 92 - 95% on Room Air.  Not currently on oxygen.Marland Kitchen        CARDIOVASCULAR Normal RRR w/o murmers or gallops. No peripheral edema.        ABDOMEN No masses palpated. Liver and spleen not palpated, normal. Bowel sounds normal, Abdomen soft, obese, and not tender..   Assessments 1. History of colon polyps - Z86.010 (Primary) 2. History of lung cancer - Z85.118, Diagnosed 08/2021. Had left upper lobectomy 10/2021. Currently doing well. Followed by pulmonology and oncology. 3. Morbid obesity - E66.01, BMI 45  Treatment 1. History of colon polyps      IMAGING: ColonoscopyJoiner, Franchesca 03/15/2022 01:51:46 PM > Was not scheduled, pending clearance from Dr. Tonia Brooms and place of procedure to be done. Pulm. clearance was faxed.Notes: I discussed risks and benefits of colonoscopy procedure with patient to include risk of bleeding or colon perforation. Patient expressed understanding and agrees to proceed with procedure.   2. History of lung cancer Notes: We are requesting pulmonary clearance from Dr. Tonia Brooms before scheduling a colonoscopy. Advise about scheduling procedure in hospital versus endoscopy center.

## 2022-06-02 LAB — SURGICAL PATHOLOGY

## 2022-06-03 NOTE — Anesthesia Postprocedure Evaluation (Signed)
Anesthesia Post Note  Patient: Kathryn [name]  Procedure(s) Performed: COLONOSCOPY WITH PROPOFOL POLYPECTOMY     Patient location during evaluation: PACU Anesthesia Type: MAC Level of consciousness: awake and alert Pain management: pain level controlled Vital Signs Assessment: post-procedure vital signs reviewed and stable Respiratory status: spontaneous breathing, nonlabored ventilation, respiratory function stable and patient connected to nasal cannula oxygen Cardiovascular status: stable and blood pressure returned to baseline Postop Assessment: no apparent nausea or vomiting Anesthetic complications: no   No notable events documented.  Last Vitals:  Vitals:   06/01/22 1050 06/01/22 1100  BP: 114/60 131/66  Pulse: 73 63  Resp: 20 17  Temp:    SpO2: 97% 98%    Last Pain:  Vitals:   06/02/22 1624  TempSrc:   PainSc: 0-No pain                 Nkechi Linehan S

## 2022-06-04 ENCOUNTER — Ambulatory Visit (HOSPITAL_COMMUNITY)
Admission: RE | Admit: 2022-06-04 | Discharge: 2022-06-04 | Disposition: A | Discharge status: Home / Self Care | Payer: Medicare Other | Source: Ambulatory Visit | Attending: Internal Medicine | Admitting: Internal Medicine

## 2022-06-04 DIAGNOSIS — C3490 Malignant neoplasm of unspecified part of unspecified bronchus or lung: Secondary | ICD-10-CM | POA: Diagnosis present

## 2022-06-04 DIAGNOSIS — C3412 Malignant neoplasm of upper lobe, left bronchus or lung: Secondary | ICD-10-CM | POA: Insufficient documentation

## 2022-06-04 LAB — POCT I-STAT CREATININE: Creatinine, Ser: 1 mg/dL (ref 0.44–1.00)

## 2022-06-04 MED ORDER — SODIUM CHLORIDE (PF) 0.9 % IJ SOLN
INTRAMUSCULAR | Status: AC
  Filled 2022-06-04: qty 50

## 2022-06-04 MED ORDER — IOHEXOL 300 MG/ML  SOLN
75.0000 mL | Freq: Once | INTRAMUSCULAR | Status: AC | PRN
  Administered 2022-06-04: 75 mL via INTRAVENOUS

## 2022-06-05 ENCOUNTER — Encounter (HOSPITAL_COMMUNITY): Payer: Self-pay | Admitting: Gastroenterology

## 2022-06-08 ENCOUNTER — Other Ambulatory Visit: Payer: Self-pay

## 2022-06-08 ENCOUNTER — Inpatient Hospital Stay: Payer: Medicare Other | Attending: Internal Medicine | Admitting: Internal Medicine

## 2022-06-08 VITALS — BP 144/81 | HR 98 | Temp 97.6°F | Resp 16 | Wt 254.2 lb

## 2022-06-08 DIAGNOSIS — C3490 Malignant neoplasm of unspecified part of unspecified bronchus or lung: Secondary | ICD-10-CM | POA: Diagnosis not present

## 2022-06-08 DIAGNOSIS — E119 Type 2 diabetes mellitus without complications: Secondary | ICD-10-CM | POA: Insufficient documentation

## 2022-06-08 DIAGNOSIS — C3412 Malignant neoplasm of upper lobe, left bronchus or lung: Secondary | ICD-10-CM | POA: Diagnosis present

## 2022-06-08 DIAGNOSIS — I10 Essential (primary) hypertension: Secondary | ICD-10-CM | POA: Insufficient documentation

## 2022-06-08 NOTE — Progress Notes (Signed)
[facility] [phone]   Fax:([phone]) [phone]  OFFICE PROGRESS NOTE  [name], PA-C [address] [place] [address]  DIAGNOSIS:  Stage IB (T2 a, N0, M0) non-small cell lung cancer, moderately differentiated invasive squamous cell carcinoma presented with left upper lobe lung nodule  PRIOR THERAPY:  status post left upper lobectomy with lymph node sampling under the care of Dr. 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 female returns to the clinic today for follow-up visit accompanied by her husband.  The patient is feeling fine today with no concerning complaints except for shortness of breath with exertion.  She was a little bit anxious about her scan results.  She denied having any chest pain, cough or hemoptysis.  She denied having any fever or chills.  She has no nausea, vomiting, diarrhea or constipation.  She went to a trip to [place] and enjoyed her time there.  She is here today for evaluation with repeat CT scan of the chest for restaging of her disease.  MEDICAL HISTORY: Past Medical History:  Diagnosis Date   Anxiety    Depression    Diabetes (HCC)    Dyspnea    GERD (gastroesophageal reflux disease)    HTN (hypertension)    Hyperglycemia    Pre-diabetes     ALLERGIES:  has No Known Allergies.  MEDICATIONS:  Current Outpatient Medications  Medication Sig Dispense Refill   ALPRAZolam (XANAX) 0.25 MG tablet Take 0.25 mg by mouth daily as needed for anxiety or sleep.     aspirin 81 MG chewable tablet Chew 81 mg by mouth daily.     atorvastatin (LIPITOR) 10 MG tablet Take 10 mg by mouth daily.     Cholecalciferol (VITAMIN D3) 125 MCG (5000 UT) TABS Take 5,000 Units by mouth daily.     Fish Oil-Krill Oil (KRILL & FISH OIL BLEND PO) Take 1 capsule by mouth daily. Mega  Red     fluocinolone (SYNALAR) 0.01 % external solution 5 drops 2 (two) times daily. Ear drops     hydrocortisone cream 1 % Apply 1 Application topically daily as needed (On ear).     Magnesium 400 MG TABS Take 400 mg by mouth daily.     metFORMIN (GLUCOPHAGE) 500 MG tablet Take 500 mg by mouth daily before supper.     Multiple Vitamins-Minerals (MULTIVITAMIN ADULTS 50+ PO) Take 1 tablet by mouth daily. Centrum silver     polyethylene glycol (MIRALAX / GLYCOLAX) 17 g packet Take 17 g by mouth daily.     triamterene-hydrochlorothiazide (DYAZIDE) 37.5-25 MG capsule Take 1 capsule by mouth daily.     verapamil (CALAN-SR) 120 MG CR tablet Take 120 mg by mouth 2 (two) times daily.     No current facility-administered medications for this visit.    SURGICAL HISTORY:  Past Surgical History:  Procedure Laterality Date   CHOLECYSTECTOMY     COLONOSCOPY WITH PROPOFOL N/A 06/01/2022   Procedure: COLONOSCOPY WITH PROPOFOL;  Surgeon: Kerin Salen, MD;  Location: [facility];  Service: Gastroenterology;  Laterality: N/A;   CYSTOSCOPY  10/22/2021   Procedure: CYSTOSCOPY, Urethral dilation, foley placement;  Surgeon: Jerilee Field, MD;  Location: [facility] OR;  Service: Urology;;   ELBOW SURGERY     INTERCOSTAL NERVE BLOCK Left 10/22/2021   Procedure: INTERCOSTAL NERVE  BLOCK;  Surgeon: Corliss Skains, MD;  Location[facility];  Service: Thoracic;  Laterality: Left;   NODE DISSECTION Left 10/22/2021   Procedure: NODE DISSECTION;  Surgeon: Corliss Skains, MD;  Location: [facility] OR;  Service: Thoracic;  Laterality: Left;   POLYPECTOMY  06/01/2022   Procedure: POLYPECTOMY;  Surgeon: Kerin Salen, MD;  Location: [facility];  Service: Gastroenterology;;    REVIEW OF SYSTEMS:  A comprehensive review of systems was negative except for: Respiratory: positive for dyspnea on exertion   PHYSICAL EXAMINATION: General appearance: alert, cooperative, fatigued, and no distress Head: Normocephalic, without obvious  abnormality, atraumatic Neck: no adenopathy, no JVD, supple, symmetrical, trachea midline, and thyroid not enlarged, symmetric, no tenderness/mass/nodules Lymph nodes: Cervical, supraclavicular, and axillary nodes normal. Resp: clear to auscultation bilaterally Back: symmetric, no curvature. ROM normal. No CVA tenderness. Cardio: regular rate and rhythm, S1, S2 normal, no murmur, click, rub or gallop GI: soft, non-tender; bowel sounds normal; no masses,  no organomegaly Extremities: extremities normal, atraumatic, no cyanosis or edema  ECOG PERFORMANCE STATUS: 1 - Symptomatic but completely ambulatory  Blood pressure (!) 144/81, pulse 98, temperature 97.6 F (36.4 C), resp. rate 16, weight 254 lb 3.2 oz (115.3 kg), SpO2 100 %.  LABORATORY DATA: Lab Results  Component Value Date   WBC 7.9 11/30/2021   HGB 14.7 11/30/2021   HCT 43.8 11/30/2021   MCV 92.2 11/30/2021   PLT 226 11/30/2021      Chemistry      Component Value Date/Time   NA 142 11/30/2021 1339   K 3.7 11/30/2021 1339   CL 104 11/30/2021 1339   CO2 34 (H) 11/30/2021 1339   BUN 19 11/30/2021 1339   CREATININE 1.00 06/04/2022 1511   CREATININE 0.99 11/30/2021 1339      Component Value Date/Time   CALCIUM 10.2 11/30/2021 1339   ALKPHOS 56 11/30/2021 1339   AST 28 11/30/2021 1339   ALT 35 11/30/2021 1339   BILITOT 0.6 11/30/2021 1339       RADIOGRAPHIC STUDIES: CT Chest W Contrast  Result Date: 06/05/2022 CLINICAL DATA:  Staging non-small-cell lung cancer * Tracking Code: BO * EXAM: CT CHEST WITH CONTRAST TECHNIQUE: Multidetector CT imaging of the chest was performed during intravenous contrast administration. RADIATION DOSE REDUCTION: This exam was performed according to the departmental dose-optimization program which includes automated exposure control, adjustment of the mA and/or kV according to patient size and/or use of iterative reconstruction technique. CONTRAST:  30mL OMNIPAQUE IOHEXOL 300 MG/ML  SOLN  COMPARISON:  Chest x-ray 12/11/2021.  PET-CT scan 09/28/2021. FINDINGS: Cardiovascular: No pericardial effusion. The heart is nonenlarged. Coronary artery calcifications are seen. Normal caliber thoracic aorta with some scattered vascular calcifications. Mediastinum/Nodes: No specific abnormal lymph node enlargement identified in the axillary regions. There are some small nodes seen in the hila. Example on the right measures 14 by 10 mm on image 60. This is low-density node which previously would have measured 15 by 11 mm in hindsight but was not hypermetabolic. Mild soft tissue thickening in the left hilum there are surgical clips on image 59 is also stable and again was not hypermetabolic. This tissue on this exam today measures 16 by 8 mm. No specific abnormal lymph node enlargement in the mediastinum. Normal caliber thoracic esophagus. There is a cystic nodule along the medial inferior margin of the right thyroid lobe measuring 9 mm. This was seen on the prior not hypermetabolic. No specific imaging follow-up. Lungs/Pleura: Right lung is grossly clear. Only mild  peripheral interstitial areas of septal thickening identified with some mild bronchiectasis. No consolidation or right-sided dominant mass. No pleural effusion or pneumothorax. Left-sided volume loss with surgical changes from resection. Associated pleural thickening. Mild areas of scarring and atelectatic change which is new from the prior. The spiculated mass is no longer identified. There is some thickening along the residual fissure extending back to the hilum. No new mass lesion, pleural effusion or pneumothorax. Upper Abdomen: Preserved adrenal glands. Slightly nodular contour to the liver. Please correlate for any history of liver disease. Prior cholecystectomy. Musculoskeletal: Scattered degenerative changes along the spine. IMPRESSION: Interval surgical resection of the previous left-sided lung mass. No new mass lesion identified or lymph node  enlargement. Presumed postsurgical thickening along the left lung the interlobar fissure. Aortic Atherosclerosis (ICD10-I70.0) and Emphysema (ICD10-J43.9). Electronically Signed   By: Karen Kays M.D.   On: 06/05/2022 13:14    ASSESSMENT AND PLAN: This is a very pleasant 72 years old white female with Stage IB (T2 a, N0, M0) non-small cell lung cancer, moderately differentiated invasive squamous cell carcinoma presented with left upper lobe lung nodule status post left upper lobectomy with lymph node sampling under the care of Dr. 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 She had repeat CT scan of the chest performed recently.  I personally and independently reviewed the scan and discussed the result with the patient and her husband. Her scan showed no concerning findings for disease recurrence or metastasis. I recommended for her to continue on observation with repeat CT scan of the chest in 6 months. The patient was advised to call immediately if she has any other concerning symptoms in the interval. The patient voices understanding of current disease status and treatment options and is in agreement with the current care plan.  All questions were answered. The patient knows to call the clinic with any problems, questions or concerns. We can certainly see the patient much sooner if necessary.  The total time spent in the appointment was 20 minutes.  Disclaimer: This note was dictated with voice recognition software. Similar sounding words can inadvertently be transcribed and may not be corrected upon review.

## 2022-06-30 ENCOUNTER — Telehealth: Payer: Self-pay | Admitting: Pulmonary Disease

## 2022-06-30 DIAGNOSIS — G4734 Idiopathic sleep related nonobstructive alveolar hypoventilation: Secondary | ICD-10-CM

## 2022-06-30 NOTE — Telephone Encounter (Signed)
I was calling PT from the recall list for a 6 mo FU. She asked if she needed th over nite O2 test. Dr. Fabio Bering last AVS notes state the following:  Overnight Pulse Oximetry  We will let you know whether or not you need O2 at night.  Pls call PT to advise @ [phone]. TY.

## 2022-06-30 NOTE — Telephone Encounter (Signed)
Dr. Valeta Harms can you please advise if patient needs to continue wearing o2 at night. She completed another ono in October and never received those results. Can you please advise?

## 2022-07-01 NOTE — Telephone Encounter (Signed)
Looked back at last phone note with ONO on RA results  Pt states last ONO was done on RA (02/10/22) She has been using her 2lpm o2 with sleep Never had repeat ONO on 2lpm  Her questions is- can she try stopping o2 with sleep   Dr Valeta Harms- she has ov with you coming up 07/12/22- would you like to order ONO on RA prior to this visit? That way can be discussed at f/u, thanks!

## 2022-07-02 NOTE — Telephone Encounter (Signed)
Called and spoke with patient. Patient verbalized understanding. ONO has been ordered.   Nothing further needed.

## 2022-07-12 ENCOUNTER — Encounter: Payer: Self-pay | Admitting: Pulmonary Disease

## 2022-07-12 ENCOUNTER — Ambulatory Visit: Payer: Medicare Other | Admitting: Pulmonary Disease

## 2022-07-12 VITALS — BP 140/80 | HR 75 | Ht 61.0 in | Wt 256.8 lb

## 2022-07-12 DIAGNOSIS — Z902 Acquired absence of lung [part of]: Secondary | ICD-10-CM | POA: Diagnosis not present

## 2022-07-12 DIAGNOSIS — C3412 Malignant neoplasm of upper lobe, left bronchus or lung: Secondary | ICD-10-CM

## 2022-07-12 DIAGNOSIS — Z87891 Personal history of nicotine dependence: Secondary | ICD-10-CM | POA: Diagnosis not present

## 2022-07-12 DIAGNOSIS — G4734 Idiopathic sleep related nonobstructive alveolar hypoventilation: Secondary | ICD-10-CM

## 2022-07-12 NOTE — Progress Notes (Signed)
Synopsis: Referred in May 2023 for lung nodule by Ramiro Harvest, PA-C  Subjective:   PATIENT ID: Kathryn [name] GENDER: female DOB: [birth year], MRN: [id number]  Chief Complaint  Patient presents with   Follow-up    F/up    This is a 72 year old female, past medical history of diabetes, hypertension, gastroesophageal reflux.  Patient had a lung cancer screening CT completed on 09/01/2021 which revealed a left upper lobe 30 mm pulmonary nodule concerning for malignancy.  Patient was referred here for evaluation and to discuss next steps.  She quit smoking 4 years ago.  She does however have a longstanding history of tobacco use.  She has not had any diagnosis of cancers in the past 5 years.  She does occasionally have a mildly productive cough.  She has not had any PFTs.  Never been told she had COPD.  Her cardiologist also has her planned for a stress test.  OV 10/05/2021: Here today for follow-up after Her recent nuclear medicine pet imaging.  Nuclear medicine pet imaging was completed on 09/28/2021 which revealed hypermetabolic uptake within the 2.5 cm left upper lobe pulmonary nodule consistent with a primary bronchogenic carcinoma. also there was asymmetric hypermetabolic nodular thickening of the right glossotonsillar sulcus recommending direct visualization.  There was no evidence of distant metastatic disease within the head neck Or abdomen pelvis.  From a respiratory standpoint she is breathing fine.  We reviewed her nuclear medicine pet imaging today in the office and discussed neck steps to include either biopsy and radiation or considerations for surgery.  OV 01/13/2022:Here today for follow-up regarding primary squamous cell carcinoma 2 months ago she had a left upper lobe lobectomy.  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  She is already established care with medical oncology, seen Dr. Earlie Server on 11/30/2021 office note reviewed.  Patient has continued observation with  repeat CT imaging in 6 months.  She has approximately a 40% 5-year recurrence rate.  Overall doing well from her recent surgery.  She did have a urology issue with difficulty for Foley placement prior to surgery but this was resolved during the hospitalization.  OV 07/12/2022: Here today for follow-up.  From respiratory standpoint she is doing well.  Had recent follow-up with Dr. Earlie Server.  Surveillance CT imaging stable.  She has questions regarding her oxygen requirement at night.  We will track down her overnight pulse oximetry.  Will look to see if these results are here.     Past Medical History:  Diagnosis Date   Anxiety    Depression    Diabetes ([place])    Dyspnea    GERD (gastroesophageal reflux disease)    HTN (hypertension)    Hyperglycemia    Pre-diabetes      Family History  Problem Relation Age of Onset   Heart failure Mother    Coronary artery disease Sister 30     Past Surgical History:  Procedure Laterality Date   CHOLECYSTECTOMY     COLONOSCOPY WITH PROPOFOL N/A 06/01/2022   Procedure: COLONOSCOPY WITH PROPOFOL;  Surgeon: Ronnette Juniper, MD;  Location: [facility];  Service: Gastroenterology;  Laterality: N/A;   CYSTOSCOPY  10/22/2021   Procedure: CYSTOSCOPY, Urethral dilation, foley placement;  Surgeon: Festus Aloe, MD;  Location: [facility];  Service: Urology;;   ELBOW SURGERY     INTERCOSTAL NERVE BLOCK Left 10/22/2021   Procedure: INTERCOSTAL NERVE BLOCK;  Surgeon: [name], MD;  Location: [facility];  Service: Thoracic;  Laterality: Left;   NODE DISSECTION Left 10/22/2021   Procedure: NODE DISSECTION;  Surgeon: [name], MD;  Location: [address];  Service: Thoracic;  Laterality: Left;   POLYPECTOMY  06/01/2022   Procedure: POLYPECTOMY;  Surgeon: Ronnette Juniper, MD;  Location: [facility];  Service: Gastroenterology;;    Social History   Socioeconomic History   Marital status: Married    Spouse name: Not on file   Number of children: Not on file    Years of education: Not on file   Highest education level: Not on file  Occupational History   Not on file  Tobacco Use   Smoking status: Former    Types: Cigarettes    Quit date: 2019    Years since quitting: 5.1   Smokeless tobacco: Never  Vaping Use   Vaping Use: Never used  Substance and Sexual Activity   Alcohol use: Yes    Comment: social   Drug use: Yes    Types: Marijuana    Comment: Smokes marijuana several times a week   Sexual activity: Not on file  Other Topics Concern   Not on file  Social History Narrative   Lives with her husband.  Together they have six children.   Social Determinants of Health   Financial Resource Strain: Not on file  Food Insecurity: No Food Insecurity (10/24/2021)   Hunger Vital Sign    Worried About Running Out of Food in the Last Year: Never true    Ran Out of Food in the Last Year: Never true  Transportation Needs: No Transportation Needs (10/24/2021)   PRAPARE - Hydrologist (Medical): No    Lack of Transportation (Non-Medical): No  Physical Activity: Not on file  Stress: Not on file  Social Connections: Not on file  Intimate Partner Violence: Not on file     No Known Allergies   Outpatient Medications Prior to Visit  Medication Sig Dispense Refill   ALPRAZolam (XANAX) 0.25 MG tablet Take 0.25 mg by mouth daily as needed for anxiety or sleep.     aspirin 81 MG chewable tablet Chew 81 mg by mouth daily.     atorvastatin (LIPITOR) 10 MG tablet Take 10 mg by mouth daily.     Cholecalciferol (VITAMIN D3) 125 MCG (5000 UT) TABS Take 5,000 Units by mouth daily.     Fish Oil-Krill Oil (KRILL & FISH OIL BLEND PO) Take 1 capsule by mouth daily. Mega Red     fluocinolone (SYNALAR) 0.01 % external solution 5 drops 2 (two) times daily. Ear drops     hydrocortisone cream 1 % Apply 1 Application topically daily as needed (On ear).     Magnesium 400 MG TABS Take 400 mg by mouth daily.     metFORMIN (GLUCOPHAGE)  500 MG tablet Take 500 mg by mouth daily before supper.     Multiple Vitamins-Minerals (MULTIVITAMIN ADULTS 50+ PO) Take 1 tablet by mouth daily. Centrum silver     polyethylene glycol (MIRALAX / GLYCOLAX) 17 g packet Take 17 g by mouth daily.     triamterene-hydrochlorothiazide (DYAZIDE) 37.5-25 MG capsule Take 1 capsule by mouth daily.     verapamil (CALAN-SR) 120 MG CR tablet Take 120 mg by mouth 2 (two) times daily.     No facility-administered medications prior to visit.    Review of Systems  Constitutional:  Negative for chills, fever, malaise/fatigue and weight loss.  HENT:  Negative for hearing loss, sore throat and tinnitus.  Eyes:  Negative for blurred vision and double vision.  Respiratory:  Negative for cough, hemoptysis, sputum production, shortness of breath, wheezing and stridor.   Cardiovascular:  Negative for chest pain, palpitations, orthopnea, leg swelling and PND.  Gastrointestinal:  Negative for abdominal pain, constipation, diarrhea, heartburn, nausea and vomiting.  Genitourinary:  Negative for dysuria, hematuria and urgency.  Musculoskeletal:  Negative for joint pain and myalgias.  Skin:  Negative for itching and rash.  Neurological:  Negative for dizziness, tingling, weakness and headaches.  Endo/Heme/Allergies:  Negative for environmental allergies. Does not bruise/bleed easily.  Psychiatric/Behavioral:  Negative for depression. The patient is not nervous/anxious and does not have insomnia.   All other systems reviewed and are negative.    Objective:  Physical Exam Vitals reviewed.  Constitutional:      General: She is not in acute distress.    Appearance: She is well-developed. She is obese.  HENT:     Head: Normocephalic and atraumatic.  Eyes:     General: No scleral icterus.    Conjunctiva/sclera: Conjunctivae normal.     Pupils: Pupils are equal, round, and reactive to light.  Neck:     Vascular: No JVD.     Trachea: No tracheal deviation.   Cardiovascular:     Rate and Rhythm: Normal rate and regular rhythm.     Heart sounds: Normal heart sounds. No murmur heard. Pulmonary:     Effort: Pulmonary effort is normal. No tachypnea, accessory muscle usage or respiratory distress.     Breath sounds: No stridor. No wheezing, rhonchi or rales.  Abdominal:     General: There is no distension.     Palpations: Abdomen is soft.     Tenderness: There is no abdominal tenderness.  Musculoskeletal:        General: No tenderness.     Cervical back: Neck supple.  Lymphadenopathy:     Cervical: No cervical adenopathy.  Skin:    General: Skin is warm and dry.     Capillary Refill: Capillary refill takes less than 2 seconds.     Findings: No rash.  Neurological:     Mental Status: She is alert and oriented to person, place, and time.  Psychiatric:        Behavior: Behavior normal.      Vitals:   07/12/22 1406  BP: (!) 140/80  Pulse: 75  SpO2: 94%  Weight: 256 lb 12.8 oz (116.5 kg)  Height: '5\' 1"'$  (1.549 m)    94% on RA BMI Readings from Last 3 Encounters:  07/12/22 48.52 kg/m  06/08/22 48.03 kg/m  06/01/22 47.24 kg/m   Wt Readings from Last 3 Encounters:  07/12/22 256 lb 12.8 oz (116.5 kg)  06/08/22 254 lb 3.2 oz (115.3 kg)  06/01/22 250 lb (113.4 kg)     CBC    Component Value Date/Time   WBC 7.9 11/30/2021 1339   WBC 10.1 10/24/2021 0535   RBC 4.75 11/30/2021 1339   HGB 14.7 11/30/2021 1339   HCT 43.8 11/30/2021 1339   PLT 226 11/30/2021 1339   MCV 92.2 11/30/2021 1339   MCH 30.9 11/30/2021 1339   MCHC 33.6 11/30/2021 1339   RDW 12.5 11/30/2021 1339   LYMPHSABS 2.4 11/30/2021 1339   MONOABS 0.6 11/30/2021 1339   EOSABS 0.2 11/30/2021 1339   BASOSABS 0.0 11/30/2021 1339     Chest Imaging: April 2023 lung cancer screening CT: 30 mm left upper lobe pulmonary nodule Report reviewed in epic care everywhere completed at  [facility].  Nuclear medicine pet imaging May [id number]: Hypermetabolic left upper lobe  pulmonary nodule concerning for primary bronchogenic carcinoma. Also hypermetabolic uptake within the posterior pharynx. The patient's images have been independently reviewed by me.    12/11/2021 chest x-ray: Stable small left pleural effusion. The patient's images have been independently reviewed by me.    CT chest January 2024: Interval resection of previous left-sided lung mass.  No adenopathy no evidence of recurrence. The patient's images have been independently reviewed by me.     Pulmonary Functions Testing Results:    Latest Ref Rng & Units 09/17/2021    9:29 AM  PFT Results  FVC-Pre L 2.05   FVC-Predicted Pre % 77   FVC-Post L 2.05   FVC-Predicted Post % 77   Pre FEV1/FVC % % 77   Post FEV1/FCV % % 74   FEV1-Pre L 1.58   FEV1-Predicted Pre % 79   FEV1-Post L 1.52   DLCO uncorrected ml/min/mmHg 18.12   DLCO UNC% % 103   DLCO corrected ml/min/mmHg 17.15   DLCO COR %Predicted % 97   DLVA Predicted % 110     FeNO:   Pathology:   Echocardiogram:   Heart Catheterization:     Assessment & Plan:     ICD-10-CM   1. Nocturnal hypoxemia  G47.34     2. 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 S/P robotic assisted lobectomy of lung  Z90.2     4. Former smoker  Z87.891        Discussion:  This is a 72 year old female, status post left upper lobe pulmonary nodule diagnosed with squamous cell carcinoma status post robotic assisted lobectomy by Dr. Kipp Brood.  Plan: Doing well post surgery.  Here today for follow-up regarding overnight pulse oximetry. Plan was for her to return in 6 months. Consideration for home sleep study pending any evidence of nocturnal desaturation. She is no longer using oxygen with exertion. She can follow-up with [place] in 1 year or as needed.    Current Outpatient Medications:    ALPRAZolam (XANAX) 0.25 MG tablet, Take 0.25 mg by mouth daily as needed for anxiety or sleep., Disp: , Rfl:     aspirin 81 MG chewable tablet, Chew 81 mg by mouth daily., Disp: , Rfl:    atorvastatin (LIPITOR) 10 MG tablet, Take 10 mg by mouth daily., Disp: , Rfl:    Cholecalciferol (VITAMIN D3) 125 MCG (5000 UT) TABS, Take 5,000 Units by mouth daily., Disp: , Rfl:    Fish Oil-Krill Oil (KRILL & FISH OIL BLEND PO), Take 1 capsule by mouth daily. Mega Red, Disp: , Rfl:    fluocinolone (SYNALAR) 0.01 % external solution, 5 drops 2 (two) times daily. Ear drops, Disp: , Rfl:    hydrocortisone cream 1 %, Apply 1 Application topically daily as needed (On ear)., Disp: , Rfl:    Magnesium 400 MG TABS, Take 400 mg by mouth daily., Disp: , Rfl:    metFORMIN (GLUCOPHAGE) 500 MG tablet, Take 500 mg by mouth daily before supper., Disp: , Rfl:    Multiple Vitamins-Minerals (MULTIVITAMIN ADULTS 50+ PO), Take 1 tablet by mouth daily. Centrum silver, Disp: , Rfl:    polyethylene glycol (MIRALAX / GLYCOLAX) 17 g packet, Take 17 g by mouth daily., Disp: , Rfl:    triamterene-hydrochlorothiazide (DYAZIDE) 37.5-25 MG capsule, Take 1 capsule by mouth daily., Disp: , Rfl:    verapamil (CALAN-SR) 120 MG  CR tablet, Take 120 mg by mouth 2 (two) times daily., Disp: , Rfl:   Garner Nash, DO [facility] 07/12/2022 2:32 PM

## 2022-07-12 NOTE — Patient Instructions (Signed)
Thank you for visiting Dr. Valeta Harms at [facility]. Today we recommend the following:  Return in about 1 year (around 07/13/2023), or if symptoms worsen or fail to improve.    Please do your part to reduce the spread of COVID-19.

## 2022-08-02 ENCOUNTER — Other Ambulatory Visit: Payer: Self-pay

## 2022-08-02 ENCOUNTER — Telehealth: Payer: Self-pay

## 2022-08-02 DIAGNOSIS — G4734 Idiopathic sleep related nonobstructive alveolar hypoventilation: Secondary | ICD-10-CM

## 2022-08-02 NOTE — Telephone Encounter (Signed)
Spoke to pt and informed her of her O2 needs at night. Put an order in for oxygen at night. Pt verbalized understanding nothing further needed.

## 2022-08-11 ENCOUNTER — Encounter: Payer: Self-pay | Admitting: Pulmonary Disease

## 2022-09-20 IMAGING — DX DG CHEST 2V
2 series · 2 of 2 positions shown · non-contrast
Comparison: 11/19/2004, PET-CT 09/28/2021

CLINICAL DATA: 70-year-old female with preoperative chest x-ray

EXAM:
CHEST - 2 VIEW

[w chest pa]
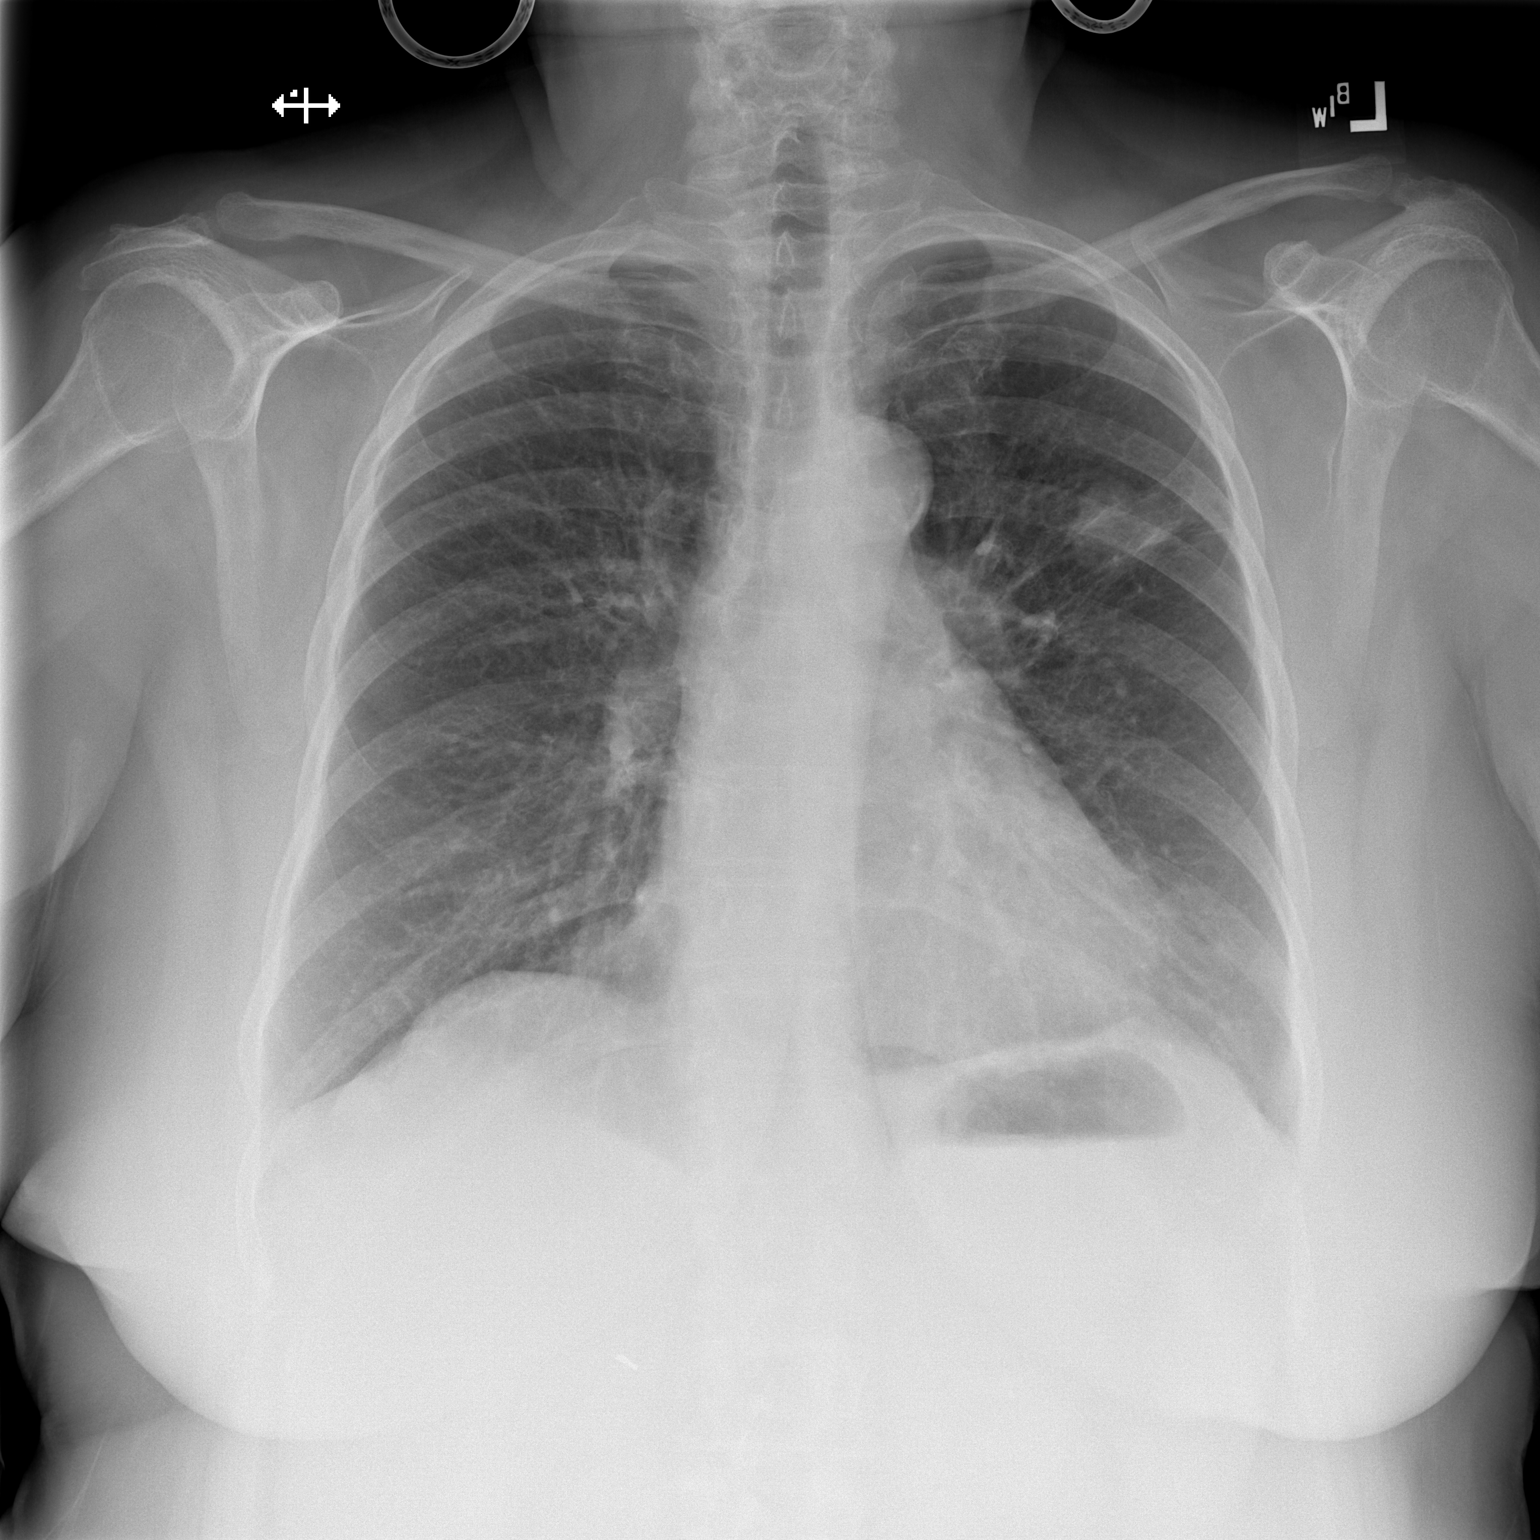

[w chest lat]
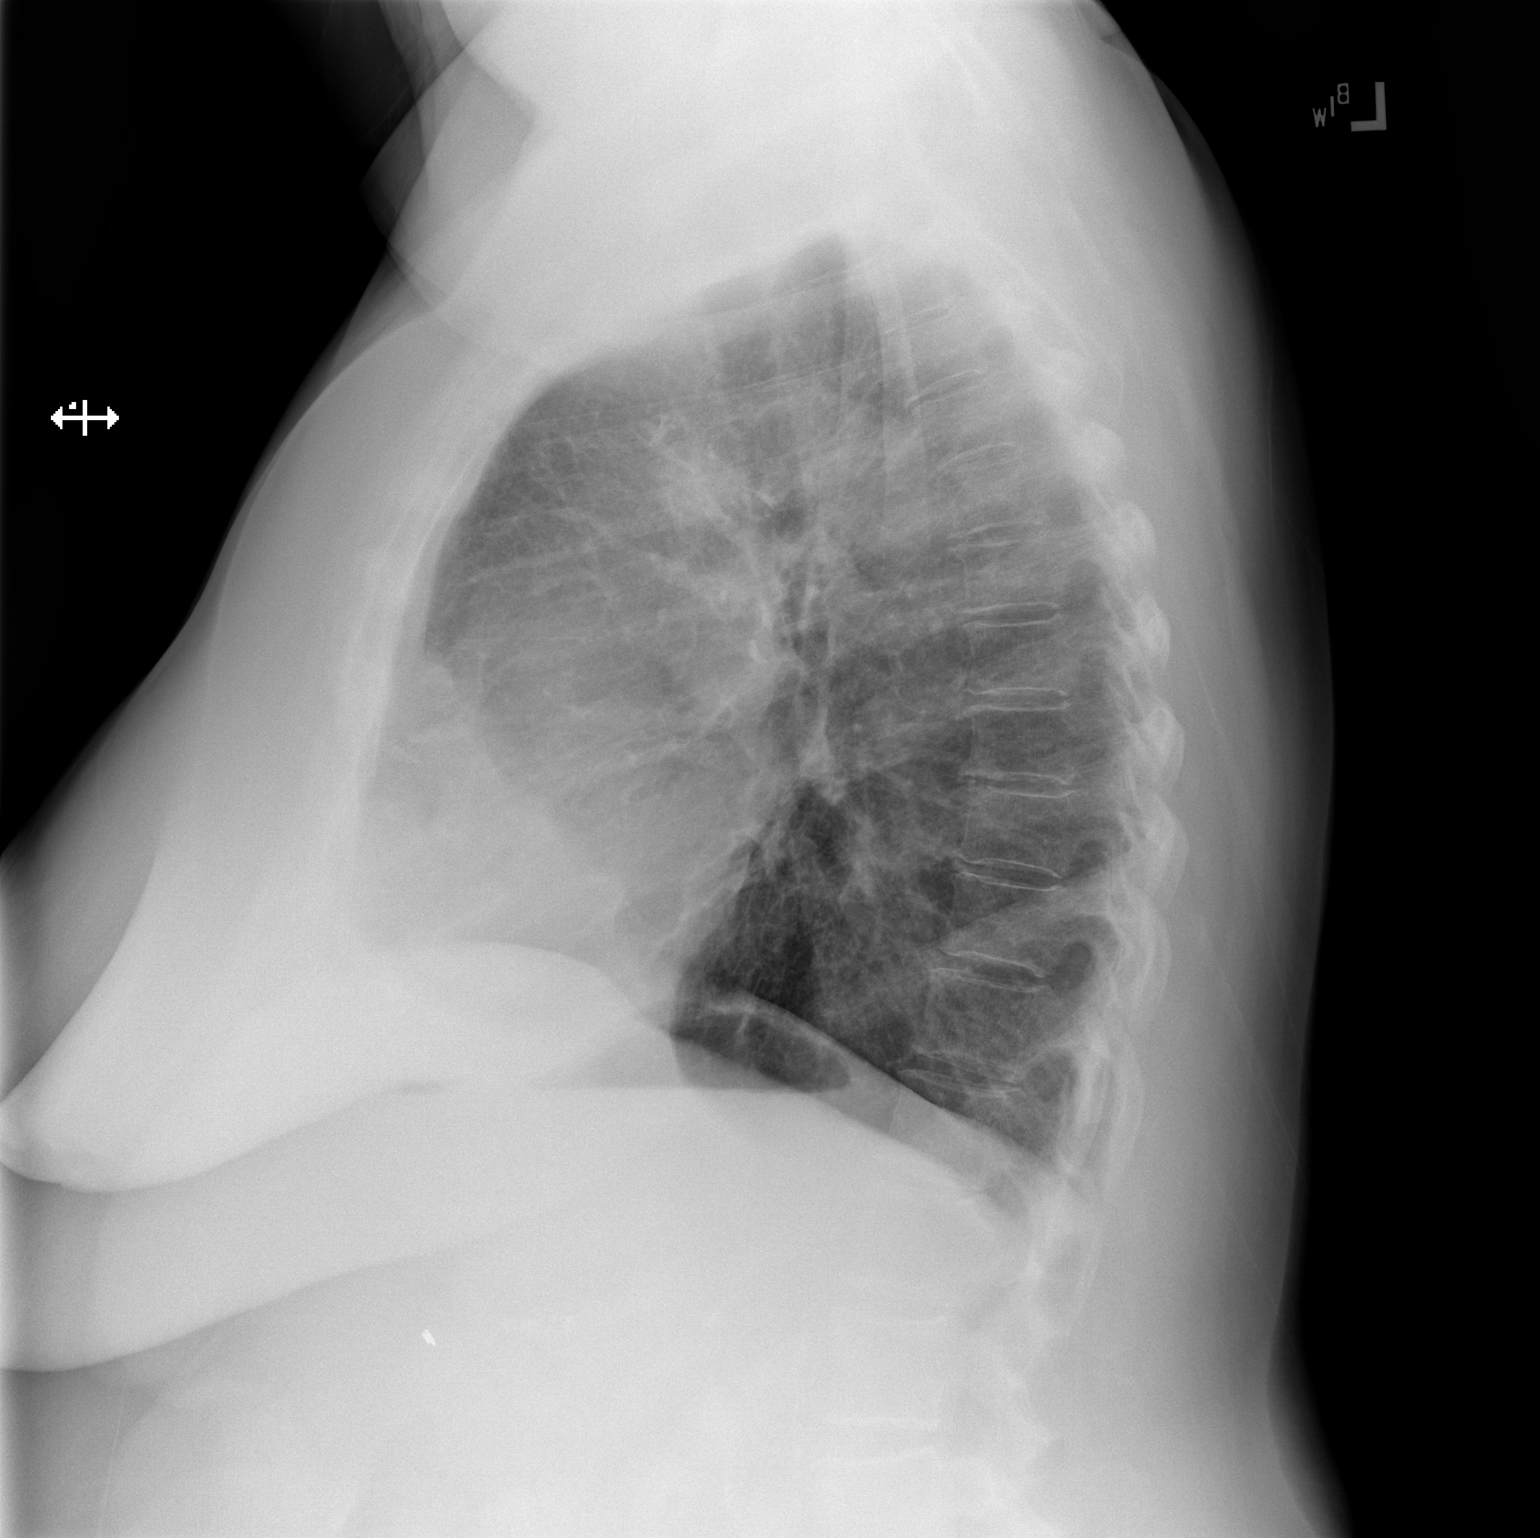

[2 of 2 positions shown; findings below may reference images not displayed]

FINDINGS: Cardiomediastinal silhouette unchanged in size and contour. No
evidence of central vascular congestion. No interlobular septal
thickening.

Left upper lung nodule, as demonstrated on recent PET-CT.

No pneumothorax or pleural effusion. Coarsened interstitial
markings, with no confluent airspace disease.

No acute displaced fracture. Degenerative changes of the spine.
IMPRESSION: Negative for acute cardiopulmonary disease.

Left upper lobe lung nodule recently demonstrated on PET-CT.

## 2022-09-22 IMAGING — DX DG CHEST 1V PORT
1 series · 1 of 1 positions shown · non-contrast
Comparison: 10/19/2021

CLINICAL DATA: Central line placement

EXAM:
PORTABLE CHEST 1 VIEW

[chest ap]
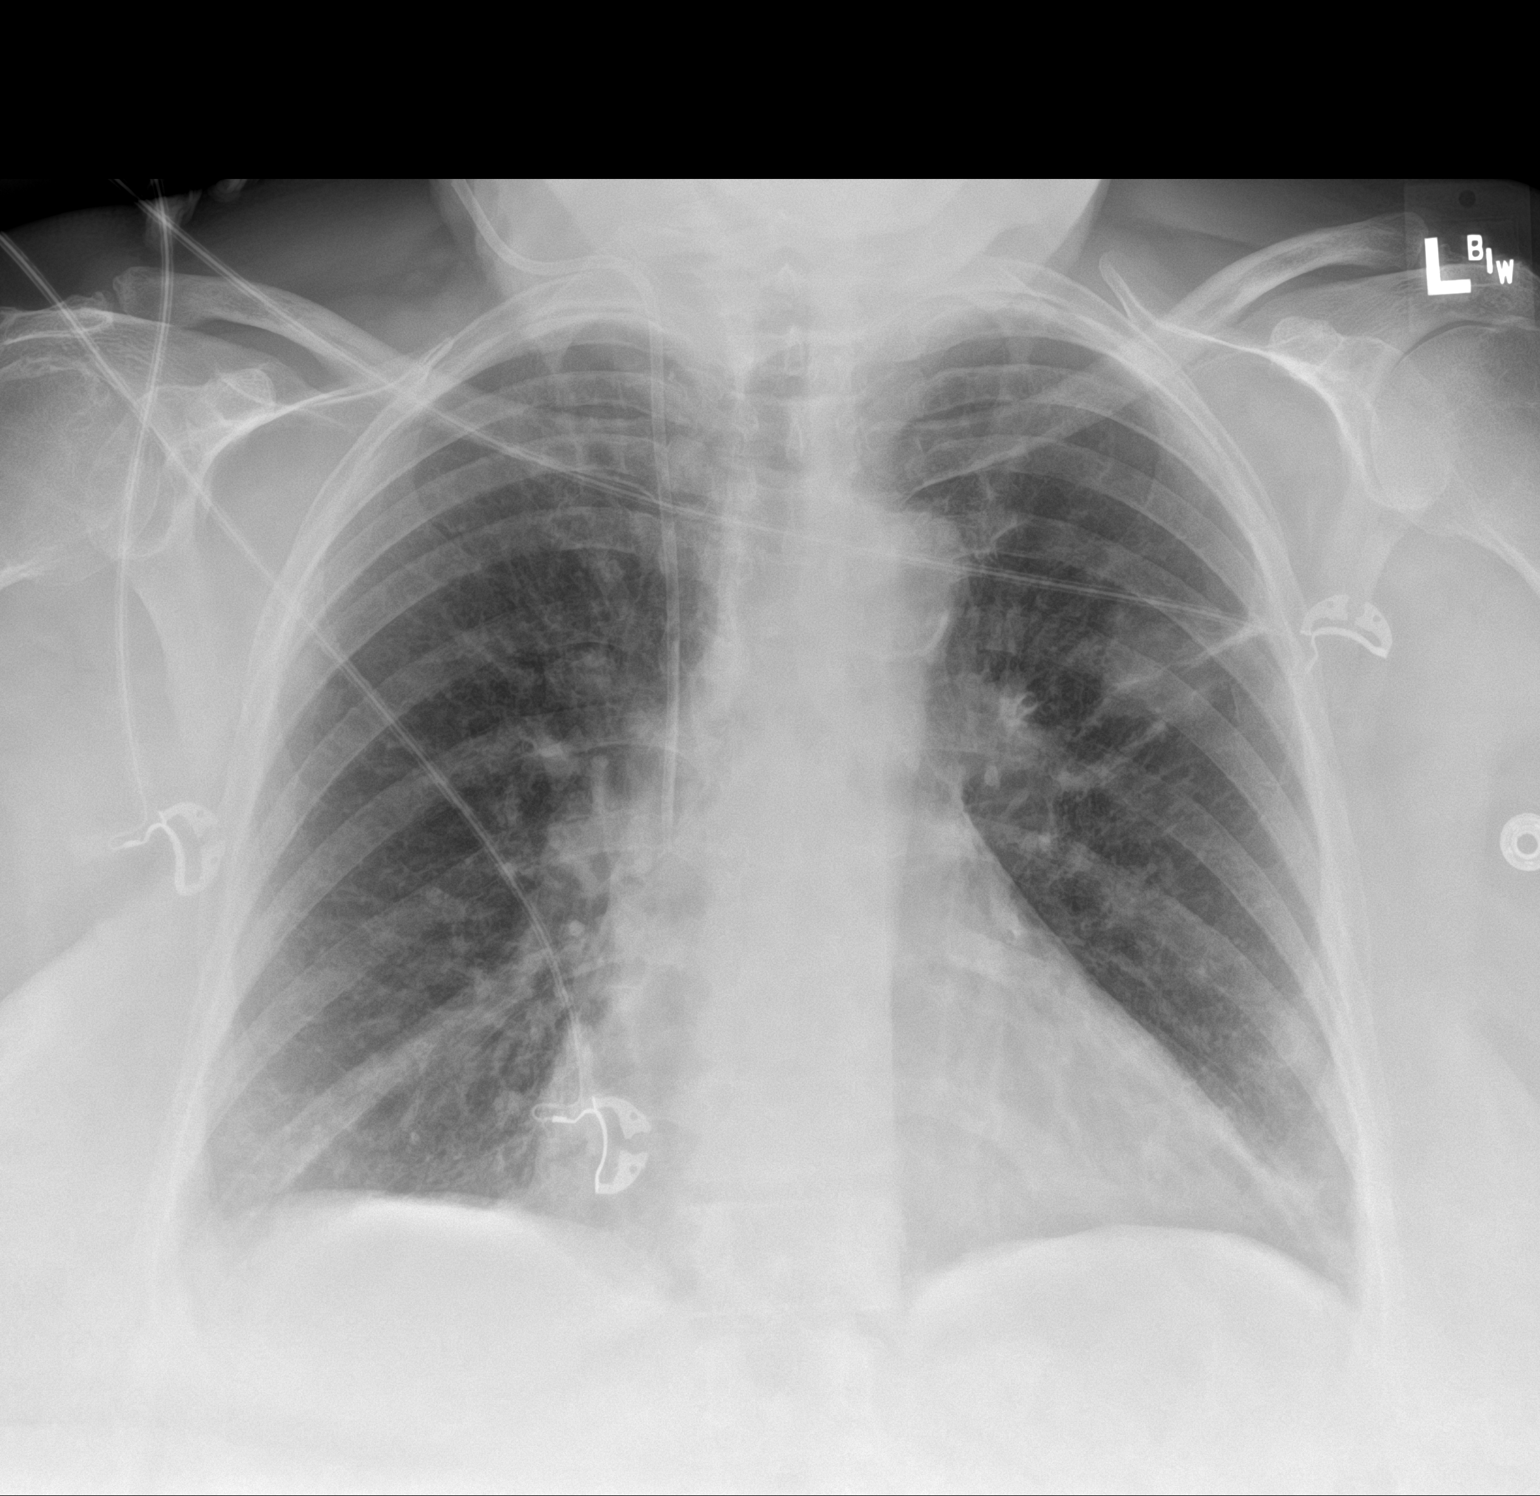

[1 of 1 positions shown; findings below may reference images not displayed]

FINDINGS: Right internal jugular central line tip in the SVC 3 cm above the
right atrium. No pneumothorax. Right lung is clear. Left upper lobe
pulmonary mass as seen previously.
IMPRESSION: Central line well positioned. No pneumothorax. Left upper lobe mass
as seen previously.

## 2022-09-23 IMAGING — DX DG CHEST 1V PORT
1 series · 1 of 1 positions shown · non-contrast
Comparison: Portable exam 5000 hours compared to 10/21/2021

CLINICAL DATA: LEFT upper lobectomy

EXAM:
PORTABLE CHEST 1 VIEW

[chest]
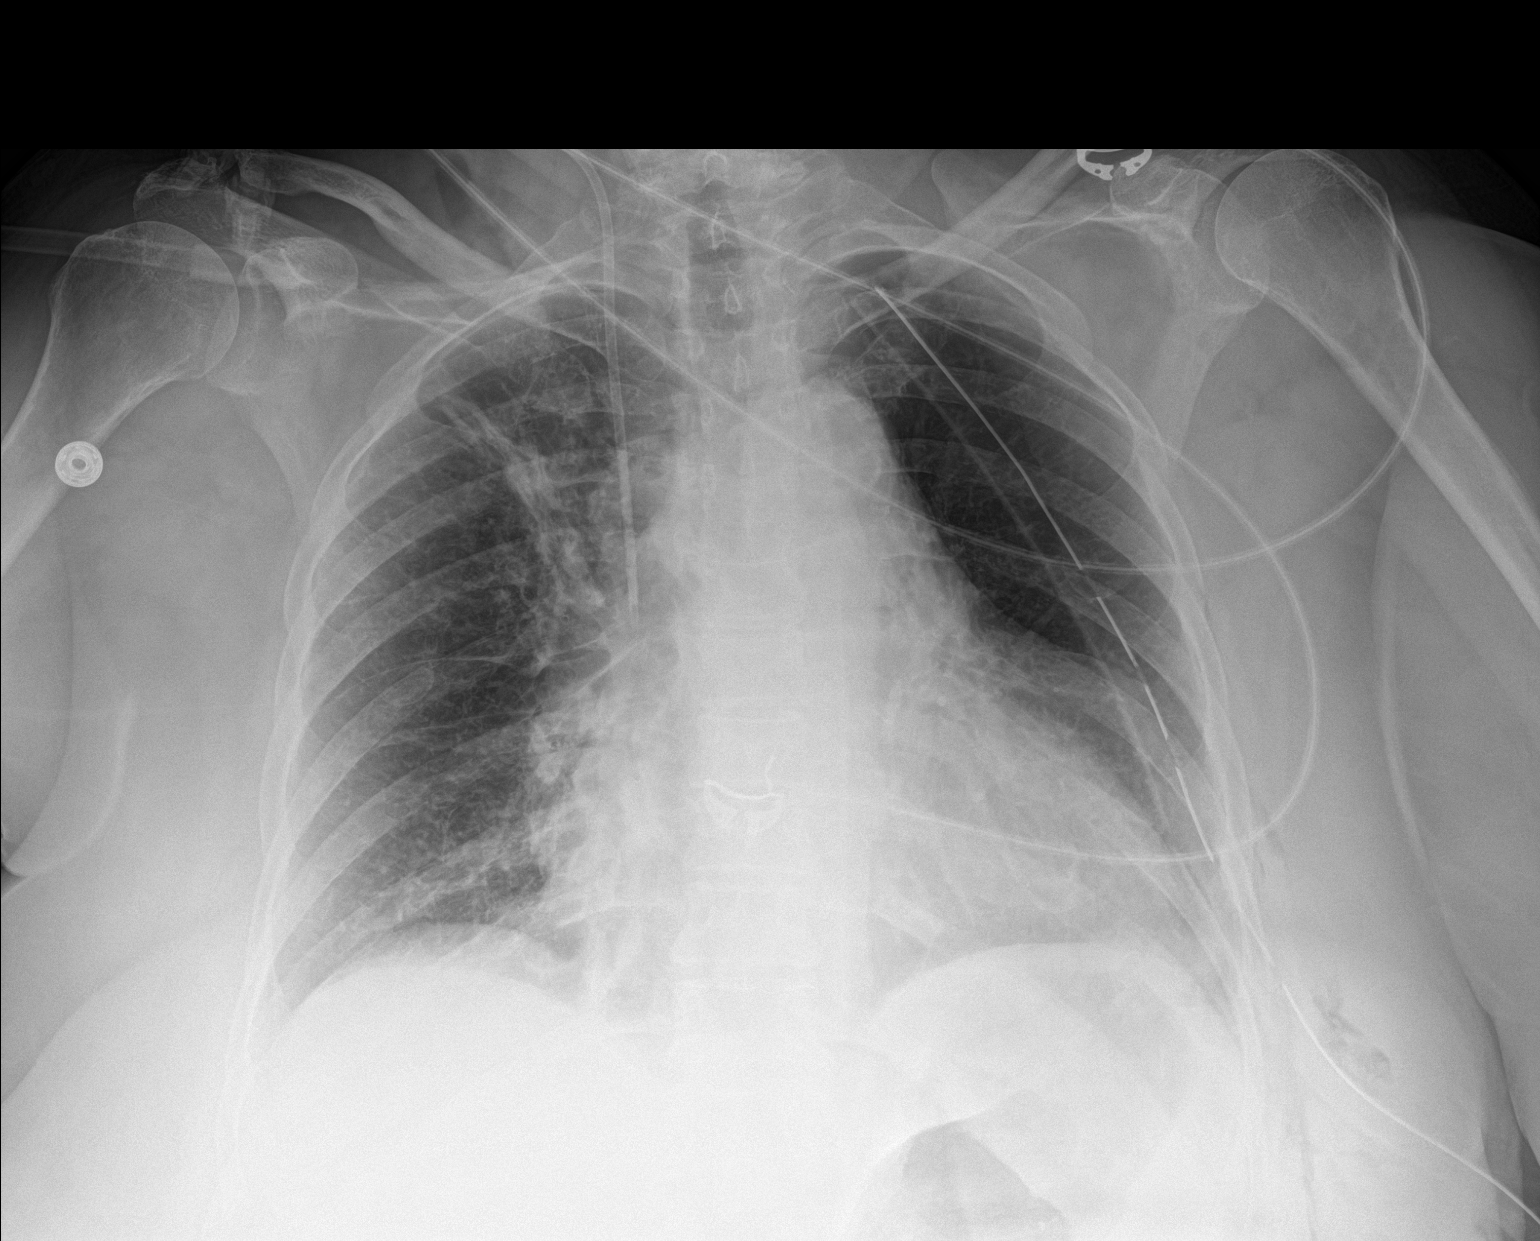

[1 of 1 positions shown; findings below may reference images not displayed]

FINDINGS: RIGHT jugular line tip projects over SVC.

New LEFT thoracostomy tube, proximal side-port extending just
outside costal margin.

Enlargement of cardiac silhouette with atherosclerotic calcification
aorta.

Subsegmental atelectasis RIGHT upper lobe with mild dependent
atelectasis RIGHT base.

LEFT lung grossly clear.

No pleural effusion or pneumothorax.
IMPRESSION: Postoperative changes as above.

Please note that the proximal side-port extends just beyond the
costal margin without evidence of pneumothorax.

## 2022-09-25 IMAGING — DX DG CHEST 1V
1 series · 1 of 1 positions shown · non-contrast
Comparison: 10/23/2021

CLINICAL DATA: Chest tube removal

EXAM:
CHEST  1 VIEW

[chest]
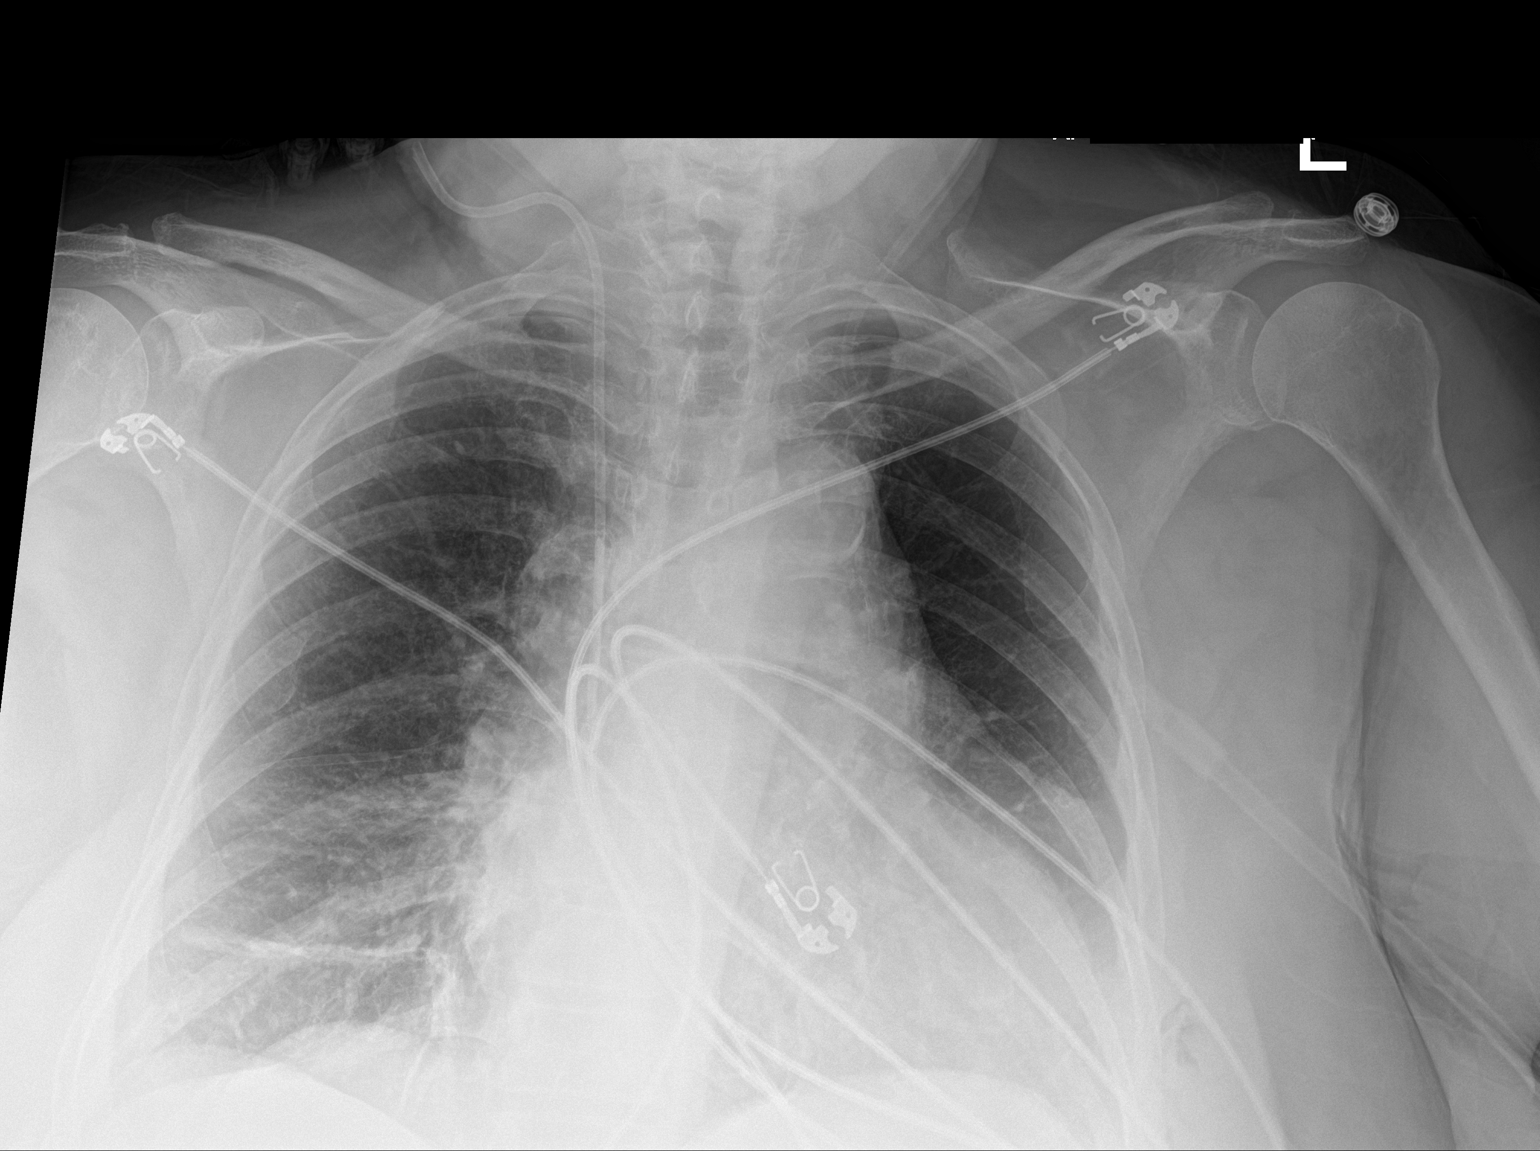

[1 of 1 positions shown; findings below may reference images not displayed]

FINDINGS: 7586 hours. Left chest tube has been removed in the interval. No
evidence for residual pneumothorax. Bibasilar atelectasis noted. The
cardio pericardial silhouette is enlarged. Right IJ central line tip
overlies the mid SVC level. Trace subcutaneous emphysema left chest
wall compatible with the presence of chest tube yesterday.
IMPRESSION: Interval removal of left chest tube without evidence for residual
pneumothorax.

## 2022-12-03 ENCOUNTER — Ambulatory Visit (HOSPITAL_COMMUNITY)
Admission: RE | Admit: 2022-12-03 | Discharge: 2022-12-03 | Disposition: A | Discharge status: Home / Self Care | Payer: Medicare Other | Source: Ambulatory Visit | Attending: Internal Medicine | Admitting: Internal Medicine

## 2022-12-03 ENCOUNTER — Inpatient Hospital Stay: Payer: Medicare Other | Attending: Internal Medicine

## 2022-12-03 ENCOUNTER — Other Ambulatory Visit: Payer: Self-pay

## 2022-12-03 ENCOUNTER — Ambulatory Visit (HOSPITAL_COMMUNITY): Payer: Medicare Other

## 2022-12-03 DIAGNOSIS — Z85118 Personal history of other malignant neoplasm of bronchus and lung: Secondary | ICD-10-CM | POA: Insufficient documentation

## 2022-12-03 DIAGNOSIS — C3490 Malignant neoplasm of unspecified part of unspecified bronchus or lung: Secondary | ICD-10-CM | POA: Diagnosis not present

## 2022-12-03 DIAGNOSIS — Z902 Acquired absence of lung [part of]: Secondary | ICD-10-CM | POA: Insufficient documentation

## 2022-12-03 LAB — CBC WITH DIFFERENTIAL (CANCER CENTER ONLY)
Abs Immature Granulocytes: 0.02 10*3/uL (ref 0.00–0.07)
Basophils Absolute: 0.1 10*3/uL (ref 0.0–0.1)
Basophils Relative: 1 %
Eosinophils Absolute: 0.1 10*3/uL (ref 0.0–0.5)
Eosinophils Relative: 2 %
HCT: 44 % (ref 36.0–46.0)
Hemoglobin: 14.8 g/dL (ref 12.0–15.0)
Immature Granulocytes: 0 %
Lymphocytes Relative: 31 %
Lymphs Abs: 2.4 10*3/uL (ref 0.7–4.0)
MCH: 31.2 pg (ref 26.0–34.0)
MCHC: 33.6 g/dL (ref 30.0–36.0)
MCV: 92.6 fL (ref 80.0–100.0)
Monocytes Absolute: 0.5 10*3/uL (ref 0.1–1.0)
Monocytes Relative: 7 %
Neutro Abs: 4.5 10*3/uL (ref 1.7–7.7)
Neutrophils Relative %: 59 %
Platelet Count: 196 10*3/uL (ref 150–400)
RBC: 4.75 MIL/uL (ref 3.87–5.11)
RDW: 12.5 % (ref 11.5–15.5)
WBC Count: 7.6 10*3/uL (ref 4.0–10.5)
nRBC: 0 % (ref 0.0–0.2)

## 2022-12-03 LAB — CMP (CANCER CENTER ONLY)
ALT: 38 U/L (ref 0–44)
AST: 30 U/L (ref 15–41)
Albumin: 4.4 g/dL (ref 3.5–5.0)
Alkaline Phosphatase: 42 U/L (ref 38–126)
Anion gap: 8 (ref 5–15)
BUN: 13 mg/dL (ref 8–23)
CO2: 30 mmol/L (ref 22–32)
Calcium: 10.3 mg/dL (ref 8.9–10.3)
Chloride: 103 mmol/L (ref 98–111)
Creatinine: 0.99 mg/dL (ref 0.44–1.00)
GFR, Estimated: >60 mL/min (ref >60)
Glucose, Bld: 138 mg/dL — ABNORMAL HIGH (ref 70–99)
Potassium: 4 mmol/L (ref 3.5–5.1)
Sodium: 141 mmol/L (ref 135–145)
Total Bilirubin: 0.7 mg/dL (ref 0.3–1.2)
Total Protein: 7.1 g/dL (ref 6.5–8.1)

## 2022-12-03 MED ORDER — IOHEXOL 300 MG/ML  SOLN
100.0000 mL | Freq: Once | INTRAMUSCULAR | Status: AC | PRN
  Administered 2022-12-03: 75 mL via INTRAVENOUS

## 2022-12-07 ENCOUNTER — Other Ambulatory Visit: Payer: Self-pay

## 2022-12-07 ENCOUNTER — Inpatient Hospital Stay: Payer: Medicare Other | Admitting: Internal Medicine

## 2022-12-07 VITALS — BP 132/83 | HR 72 | Temp 98.1°F | Resp 18 | Ht 61.0 in | Wt 263.2 lb

## 2022-12-07 DIAGNOSIS — Z85118 Personal history of other malignant neoplasm of bronchus and lung: Secondary | ICD-10-CM | POA: Diagnosis present

## 2022-12-07 DIAGNOSIS — Z902 Acquired absence of lung [part of]: Secondary | ICD-10-CM | POA: Diagnosis not present

## 2022-12-07 DIAGNOSIS — C3490 Malignant neoplasm of unspecified part of unspecified bronchus or lung: Secondary | ICD-10-CM | POA: Diagnosis not present

## 2022-12-07 NOTE — Progress Notes (Signed)
[facility] [phone]   [phone]  OFFICE PROGRESS NOTE  [name], PA-C [address] [place] [address]  DIAGNOSIS:  Stage IB (T2 a, N0, M0) non-small cell lung cancer, moderately differentiated invasive squamous cell carcinoma presented with left upper lobe lung nodule  PRIOR THERAPY:  status post left upper lobectomy with lymph node sampling under the care of Dr. 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 female returns to the clinic today for 6 months follow-up visit.  The patient is feeling fine today with no concerning complaints except for mild fatigue and occasional shortness of breath with exertion but no significant chest pain cough or hemoptysis.  She has no nausea, vomiting, diarrhea or constipation.  She has no recent weight loss or night sweats.  She has no headache or visual changes.  She is here today for evaluation with repeat CT scan of the chest for restaging of her disease.   MEDICAL HISTORY: Past Medical History:  Diagnosis Date   Anxiety    Depression    Diabetes (HCC)    Dyspnea    GERD (gastroesophageal reflux disease)    HTN (hypertension)    Hyperglycemia    Pre-diabetes     ALLERGIES:  has No Known Allergies.  MEDICATIONS:  Current Outpatient Medications  Medication Sig Dispense Refill   ALPRAZolam (XANAX) 0.25 MG tablet Take 0.25 mg by mouth daily as needed for anxiety or sleep.     aspirin 81 MG chewable tablet Chew 81 mg by mouth daily.     atorvastatin (LIPITOR) 10 MG tablet Take 10 mg by mouth daily.     Cholecalciferol (VITAMIN D3) 125 MCG (5000 UT) TABS Take 5,000 Units by mouth daily.     Fish Oil-Krill Oil (KRILL & FISH OIL BLEND PO) Take 1 capsule by mouth daily. Mega Red     fluocinolone (SYNALAR) 0.01 % external  solution 5 drops 2 (two) times daily. Ear drops     hydrocortisone cream 1 % Apply 1 Application topically daily as needed (On ear).     Magnesium 400 MG TABS Take 400 mg by mouth daily.     metFORMIN (GLUCOPHAGE) 500 MG tablet Take 500 mg by mouth daily before supper.     Multiple Vitamins-Minerals (MULTIVITAMIN ADULTS 50+ PO) Take 1 tablet by mouth daily. Centrum silver     polyethylene glycol (MIRALAX / GLYCOLAX) 17 g packet Take 17 g by mouth daily.     triamterene-hydrochlorothiazide (DYAZIDE) 37.5-25 MG capsule Take 1 capsule by mouth daily.     verapamil (CALAN-SR) 120 MG CR tablet Take 120 mg by mouth 2 (two) times daily.     No current facility-administered medications for this visit.    SURGICAL HISTORY:  Past Surgical History:  Procedure Laterality Date   CHOLECYSTECTOMY     COLONOSCOPY WITH PROPOFOL N/A 06/01/2022   Procedure: COLONOSCOPY WITH PROPOFOL;  Surgeon: Kerin Salen, MD;  Location: [facility];  Service: Gastroenterology;  Laterality: N/A;   CYSTOSCOPY  10/22/2021   Procedure: CYSTOSCOPY, Urethral dilation, foley placement;  Surgeon: Jerilee Field, MD;  Location: [facility] OR;  Service: Urology;;   ELBOW SURGERY     INTERCOSTAL NERVE BLOCK Left 10/22/2021   Procedure: INTERCOSTAL NERVE BLOCK;  Surgeon: Corliss Skains, MD;  Location: [facility] OR;  Service:  Thoracic;  Laterality: Left;   NODE DISSECTION Left 10/22/2021   Procedure: NODE DISSECTION;  Surgeon: Corliss Skains, MD;  Location: [facility] OR;  Service: Thoracic;  Laterality: Left;   POLYPECTOMY  06/01/2022   Procedure: POLYPECTOMY;  Surgeon: Kerin Salen, MD;  Location: [facility];  Service: Gastroenterology;;    REVIEW OF SYSTEMS:  A comprehensive review of systems was negative except for: Constitutional: positive for fatigue Respiratory: positive for dyspnea on exertion   PHYSICAL EXAMINATION: General appearance: alert, cooperative, fatigued, and no distress Head: Normocephalic, without obvious abnormality,  atraumatic Neck: no adenopathy, no JVD, supple, symmetrical, trachea midline, and thyroid not enlarged, symmetric, no tenderness/mass/nodules Lymph nodes: Cervical, supraclavicular, and axillary nodes normal. Resp: clear to auscultation bilaterally Back: symmetric, no curvature. ROM normal. No CVA tenderness. Cardio: regular rate and rhythm, S1, S2 normal, no murmur, click, rub or gallop GI: soft, non-tender; bowel sounds normal; no masses,  no organomegaly Extremities: extremities normal, atraumatic, no cyanosis or edema  ECOG PERFORMANCE STATUS: 1 - Symptomatic but completely ambulatory  Blood pressure 132/83, pulse 72, temperature 98.1 F (36.7 C), temperature source Oral, resp. rate 18, height 5\' 1"  (1.549 m), weight 263 lb 3.2 oz (119.4 kg), SpO2 95%.  LABORATORY DATA: Lab Results  Component Value Date   WBC 7.6 12/03/2022   HGB 14.8 12/03/2022   HCT 44.0 12/03/2022   MCV 92.6 12/03/2022   PLT 196 12/03/2022      Chemistry      Component Value Date/Time   NA 141 12/03/2022 0931   K 4.0 12/03/2022 0931   CL 103 12/03/2022 0931   CO2 30 12/03/2022 0931   BUN 13 12/03/2022 0931   CREATININE 0.99 12/03/2022 0931      Component Value Date/Time   CALCIUM 10.3 12/03/2022 0931   ALKPHOS 42 12/03/2022 0931   AST 30 12/03/2022 0931   ALT 38 12/03/2022 0931   BILITOT 0.7 12/03/2022 0931       RADIOGRAPHIC STUDIES: CT Chest W Contrast  Result Date: 12/07/2022 CLINICAL DATA:  Non-small cell lung cancer staging; * Tracking Code: BO * EXAM: CT CHEST WITH CONTRAST TECHNIQUE: Multidetector CT imaging of the chest was performed during intravenous contrast administration. RADIATION DOSE REDUCTION: This exam was performed according to the departmental dose-optimization program which includes automated exposure control, adjustment of the mA and/or kV according to patient size and/or use of iterative reconstruction technique. CONTRAST:  75mL OMNIPAQUE IOHEXOL 300 MG/ML  SOLN COMPARISON:   Chest CT dated June 04, 2022; PET-CT dated Sep 28, 2021 FINDINGS: Cardiovascular: Normal heart size. No pericardial effusion. Normal caliber thoracic aorta with mild atherosclerotic disease. Mild coronary artery calcifications. Mediastinum/Nodes: Esophagus and thyroid are unremarkable. No enlarged lymph nodes seen in the chest. Lungs/Pleura: Stable postsurgical findings of prior left upper lobectomy. Remaining central airways are patent. No new or enlarging pulmonary nodules. No pleural effusion. Mild centrilobular emphysema. Upper Abdomen: No acute abnormality. Musculoskeletal: No aggressive appearing osseous lesions. IMPRESSION: 1. Stable postsurgical findings of prior left upper lobectomy. No evidence of recurrent or metastatic disease. 2. Aortic Atherosclerosis (ICD10-I70.0) and Emphysema (ICD10-J43.9). Electronically Signed   By: Allegra Lai M.D.   On: 12/07/2022 08:53    ASSESSMENT AND PLAN: This is a very pleasant 72 years old white female with Stage IB (T2 a, N0, M0) non-small cell lung cancer, moderately differentiated invasive squamous cell carcinoma presented with left upper lobe lung nodule status post left upper lobectomy with lymph node sampling under the care of Dr. 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 She had repeat CT scan of the chest performed recently.  I personally and independently reviewed the scan and discussed the results with the patient today. Her scan showed no concerning findings for disease recurrence or metastasis. I recommended for her to continue on observation with repeat CT scan of the chest in 6 months. She was advised to call immediately if she has any other concerning symptoms in the interval. The patient voices understanding of current disease status and treatment options and  is in agreement with the current care plan.  All questions were answered. The patient knows to call the clinic with any problems, questions or concerns. We can certainly see the patient much sooner if necessary.  The total time spent in the appointment was 20 minutes.  Disclaimer: This note was dictated with voice recognition software. Similar sounding words can inadvertently be transcribed and may not be corrected upon review.

## 2023-02-14 ENCOUNTER — Ambulatory Visit (INDEPENDENT_AMBULATORY_CARE_PROVIDER_SITE_OTHER): Payer: Medicare Other | Admitting: Orthopedic Surgery

## 2023-02-14 ENCOUNTER — Other Ambulatory Visit (INDEPENDENT_AMBULATORY_CARE_PROVIDER_SITE_OTHER): Payer: Medicare Other

## 2023-02-14 DIAGNOSIS — G8929 Other chronic pain: Secondary | ICD-10-CM | POA: Diagnosis not present

## 2023-02-14 DIAGNOSIS — M25562 Pain in left knee: Secondary | ICD-10-CM

## 2023-02-15 ENCOUNTER — Encounter: Payer: Self-pay | Admitting: Orthopedic Surgery

## 2023-02-15 DIAGNOSIS — G8929 Other chronic pain: Secondary | ICD-10-CM

## 2023-02-15 DIAGNOSIS — M25562 Pain in left knee: Secondary | ICD-10-CM

## 2023-02-15 MED ORDER — LIDOCAINE HCL (PF) 1 % IJ SOLN
5.0000 mL | INTRAMUSCULAR | Status: AC | PRN
  Administered 2023-02-15: 5 mL

## 2023-02-15 MED ORDER — METHYLPREDNISOLONE ACETATE 40 MG/ML IJ SUSP
40.0000 mg | INTRAMUSCULAR | Status: AC | PRN
  Administered 2023-02-15: 40 mg via INTRA_ARTICULAR

## 2023-02-15 NOTE — Progress Notes (Signed)
Office Visit Note   Patient: Kathryn [name]           Date of Birth: [birth year]           MRN: [id number] Visit Date: 02/14/2023              Requested by: [name], PA-C [address] [place],  [place] [address] PCP: [name], PA-C  Chief Complaint  Patient presents with   Left Knee - Pain      HPI: Patient is a 72 year old woman who was seen for initial evaluation for left knee pain.  She states she has noticed swelling in the leg as well as knee and popliteal fossa for the last 9 months.  Patient complains of fullness and swelling in the popliteal fossa.  She states she is now on oxygen at home.  Assessment & Plan: Visit Diagnoses:  1. Chronic pain of left knee     Plan: The left knee was injected from the anterior medial apartment she tolerated this well discussed that if she only has temporary relief we could request authorization for hyaluronic acid injections.  She may also follow-up for repeat steroid injections about every 3 months if needed.  Discussed the importance of weight loss and strengthening.  Also recommended wearing knee-high compression socks.  Follow-Up Instructions: Return if symptoms worsen or fail to improve.   Ortho Exam  Patient is alert, oriented, no adenopathy, well-dressed, normal affect, normal respiratory effort. Examination patient has pitting edema both lower extremities worse on the left than the right.  The increased swelling in the left leg is most likely due to swelling in the popliteal fossa decreasing venous outflow.  She does have a mild effusion of the left knee collaterals and cruciates are stable there is crepitation with range of motion and tenderness to palpation the patellofemoral joint as well as medial and lateral joint lines.  Imaging: XR Knee 1-2 Views Left  Result Date: 02/15/2023 2 view radiographs of the left knee shows calcification of the femoral artery.  There is varus alignment of both knees  with periarticular bony spurs in all 3 compartments with joint space narrowing medially.  No images are attached to the encounter.  Labs: Lab Results  Component Value Date   HGBA1C 6.0 (H) 10/19/2021     Lab Results  Component Value Date   ALBUMIN 4.4 12/03/2022   ALBUMIN 4.4 11/30/2021   ALBUMIN 3.4 (L) 10/24/2021    No results found for: "MG" No results found for: "VD25OH"  No results found for: "PREALBUMIN"    Latest Ref Rng & Units 12/03/2022    9:31 AM 11/30/2021    1:39 PM 10/24/2021    5:35 AM  CBC EXTENDED  WBC 4.0 - 10.5 K/uL 7.6  7.9  10.1   RBC 3.87 - 5.11 MIL/uL 4.75  4.75  4.08   Hemoglobin 12.0 - 15.0 g/dL 70.6  23.7  62.8   HCT 36.0 - 46.0 % 44.0  43.8  39.1   Platelets 150 - 400 K/uL 196  226  163   NEUT# 1.7 - 7.7 K/uL 4.5  4.6    Lymph# 0.7 - 4.0 K/uL 2.4  2.4       There is no height or weight on file to calculate BMI.  Orders:  Orders Placed This Encounter  Procedures   XR Knee 1-2 Views Left   No orders of the defined types were placed in this encounter.  Procedures: Large Joint Inj: L knee on 02/15/2023 10:28 AM Indications: pain and diagnostic evaluation Details: 22 G 1.5 in needle, anteromedial approach  Arthrogram: No  Medications: 5 mL lidocaine (PF) 1 %; 40 mg methylPREDNISolone acetate 40 MG/ML Outcome: tolerated well, no immediate complications Procedure, treatment alternatives, risks and benefits explained, specific risks discussed. Consent was given by the patient. Immediately prior to procedure a time out was called to verify the correct patient, procedure, equipment, support staff and site/side marked as required. Patient was prepped and draped in the usual sterile fashion.      Clinical Data: No additional findings.  ROS:  All other systems negative, except as noted in the HPI. Review of Systems  Objective: Vital Signs: There were no vitals taken for this visit.  Specialty Comments:  No specialty comments  available.  PMFS History: Patient Active Problem List   Diagnosis Date Noted   Primary squamous cell carcinoma of upper lobe of left lung (HCC) 11/30/2021   Lung mass 10/21/2021   Hyperglycemia 09/07/2021   Past Medical History:  Diagnosis Date   Anxiety    Depression    Diabetes (HCC)    Dyspnea    GERD (gastroesophageal reflux disease)    HTN (hypertension)    Hyperglycemia    Pre-diabetes     Family History  Problem Relation Age of Onset   Heart failure Mother    Coronary artery disease Sister 33    Past Surgical History:  Procedure Laterality Date   CHOLECYSTECTOMY     COLONOSCOPY WITH PROPOFOL N/A 06/01/2022   Procedure: COLONOSCOPY WITH PROPOFOL;  Surgeon: Kerin Salen, MD;  Location: [facility];  Service: Gastroenterology;  Laterality: N/A;   CYSTOSCOPY  10/22/2021   Procedure: CYSTOSCOPY, Urethral dilation, foley placement;  Surgeon: Jerilee Field, MD;  Location: [facility] OR;  Service: Urology;;   ELBOW SURGERY     INTERCOSTAL NERVE BLOCK Left 10/22/2021   Procedure: INTERCOSTAL NERVE BLOCK;  Surgeon: Corliss Skains, MD;  Location[facility];  Service: Thoracic;  Laterality: Left;   NODE DISSECTION Left 10/22/2021   Procedure: NODE DISSECTION;  Surgeon: Corliss Skains, MD;  Location: [facility] OR;  Service: Thoracic;  Laterality: Left;   POLYPECTOMY  06/01/2022   Procedure: POLYPECTOMY;  Surgeon: Kerin Salen, MD;  Location: [facility];  Service: Gastroenterology;;   Social History   Occupational History   Not on file  Tobacco Use   Smoking status: Former    Current packs/day: 0.00    Types: Cigarettes    Quit date: 2019    Years since quitting: 5.7   Smokeless tobacco: Never  Vaping Use   Vaping status: Never Used  Substance and Sexual Activity   Alcohol use: Yes    Comment: social   Drug use: Yes    Types: Marijuana    Comment: Smokes marijuana several times a week   Sexual activity: Not on file

## 2023-06-02 ENCOUNTER — Ambulatory Visit (HOSPITAL_COMMUNITY)
Admission: RE | Admit: 2023-06-02 | Discharge: 2023-06-02 | Disposition: A | Discharge status: Home / Self Care | Payer: Medicare Other | Source: Ambulatory Visit | Attending: Internal Medicine | Admitting: Internal Medicine

## 2023-06-02 ENCOUNTER — Inpatient Hospital Stay: Payer: Medicare Other | Attending: Internal Medicine

## 2023-06-02 DIAGNOSIS — R600 Localized edema: Secondary | ICD-10-CM | POA: Insufficient documentation

## 2023-06-02 DIAGNOSIS — C3490 Malignant neoplasm of unspecified part of unspecified bronchus or lung: Secondary | ICD-10-CM | POA: Insufficient documentation

## 2023-06-02 DIAGNOSIS — R7303 Prediabetes: Secondary | ICD-10-CM | POA: Diagnosis not present

## 2023-06-02 DIAGNOSIS — Z85118 Personal history of other malignant neoplasm of bronchus and lung: Secondary | ICD-10-CM | POA: Insufficient documentation

## 2023-06-02 LAB — CMP (CANCER CENTER ONLY)
ALT: 28 U/L (ref 0–44)
AST: 26 U/L (ref 15–41)
Albumin: 4.4 g/dL (ref 3.5–5.0)
Alkaline Phosphatase: 43 U/L (ref 38–126)
Anion gap: 6 (ref 5–15)
BUN: 14 mg/dL (ref 8–23)
CO2: 33 mmol/L — ABNORMAL HIGH (ref 22–32)
Calcium: 10.2 mg/dL (ref 8.9–10.3)
Chloride: 101 mmol/L (ref 98–111)
Creatinine: 1.05 mg/dL — ABNORMAL HIGH (ref 0.44–1.00)
GFR, Estimated: 56 mL/min — ABNORMAL LOW (ref >60)
Glucose, Bld: 162 mg/dL — ABNORMAL HIGH (ref 70–99)
Potassium: 3.6 mmol/L (ref 3.5–5.1)
Sodium: 140 mmol/L (ref 135–145)
Total Bilirubin: 0.7 mg/dL (ref 0.0–1.2)
Total Protein: 7.1 g/dL (ref 6.5–8.1)

## 2023-06-02 LAB — CBC WITH DIFFERENTIAL (CANCER CENTER ONLY)
Abs Immature Granulocytes: 0.02 10*3/uL (ref 0.00–0.07)
Basophils Absolute: 0 10*3/uL (ref 0.0–0.1)
Basophils Relative: 0 %
Eosinophils Absolute: 0.2 10*3/uL (ref 0.0–0.5)
Eosinophils Relative: 2 %
HCT: 42.6 % (ref 36.0–46.0)
Hemoglobin: 14.1 g/dL (ref 12.0–15.0)
Immature Granulocytes: 0 %
Lymphocytes Relative: 28 %
Lymphs Abs: 2.4 10*3/uL (ref 0.7–4.0)
MCH: 30.8 pg (ref 26.0–34.0)
MCHC: 33.1 g/dL (ref 30.0–36.0)
MCV: 93 fL (ref 80.0–100.0)
Monocytes Absolute: 0.6 10*3/uL (ref 0.1–1.0)
Monocytes Relative: 7 %
Neutro Abs: 5.2 10*3/uL (ref 1.7–7.7)
Neutrophils Relative %: 63 %
Platelet Count: 199 10*3/uL (ref 150–400)
RBC: 4.58 MIL/uL (ref 3.87–5.11)
RDW: 12.7 % (ref 11.5–15.5)
WBC Count: 8.5 10*3/uL (ref 4.0–10.5)
nRBC: 0 % (ref 0.0–0.2)

## 2023-06-02 MED ORDER — IOHEXOL 300 MG/ML  SOLN
75.0000 mL | Freq: Once | INTRAMUSCULAR | Status: AC | PRN
  Administered 2023-06-02: 75 mL via INTRAVENOUS

## 2023-06-09 ENCOUNTER — Inpatient Hospital Stay: Payer: Medicare Other | Admitting: Internal Medicine

## 2023-06-09 VITALS — BP 158/78 | HR 76 | Temp 98.1°F | Resp 16 | Ht 61.0 in | Wt 265.1 lb

## 2023-06-09 DIAGNOSIS — C3490 Malignant neoplasm of unspecified part of unspecified bronchus or lung: Secondary | ICD-10-CM | POA: Diagnosis not present

## 2023-06-09 NOTE — Progress Notes (Signed)
[facility] Telephone:([phone]) [phone]   Fax:([phone]  OFFICE PROGRESS NOTE  Kathryn Hatch, PA-C [address] [place] [address]  DIAGNOSIS:  Stage IB (T2 a, N0, M0) non-small cell lung cancer, moderately differentiated invasive squamous cell carcinoma presented with left upper lobe lung nodule  PRIOR THERAPY:  status post left upper lobectomy with lymph node sampling under the care of Dr. Cliffton Asters and the final pathology showed 3.5 cm squamous cell carcinoma with visceral pleural involvement diagnosed in June 2023   CURRENT THERAPY: Observation  INTERVAL HISTORY: Kathryn [name] 73 y.o. female returns to the clinic today for follow-up visit accompanied by her husband.Discussed the use of AI scribe software for clinical note transcription with the patient, who gave verbal consent to proceed.  History of Present Illness   Kathryn [name], a 73 year old patient with a history of stage 1B non-small cell lung cancer (squamous cell carcinoma), underwent a left upper lobectomy in June 2023. Since the surgery, she has been under observation with no additional treatment. Over the past six months, she reports feeling great overall, with no new symptoms or health concerns. However, she continues to use oxygen at night due to a decrease in oxygen saturation levels during sleep, the extent of which is unknown as she does not monitor it while sleeping.  She also reports a transient 'tickle' in the throat, which was managed with a throat lozenge. During a recent lab work, an elevated blood sugar level was noted, indicating a possible prediabetic state.  She also reports occasional swelling in the left leg and foot, particularly after prolonged sitting. To manage this, she has started using compression socks. Despite these minor health concerns, her recent scan showed no evidence of recurrent or metastatic disease in the chest.  She is currently on multiple  medications and vitamins, some of which were suggested by acquaintances. She expressed concern about the number of pills she is taking and the potential impact on her kidney function, which has been slightly elevated.        MEDICAL HISTORY: Past Medical History:  Diagnosis Date   Anxiety    Depression    Diabetes (HCC)    Dyspnea    GERD (gastroesophageal reflux disease)    HTN (hypertension)    Hyperglycemia    Pre-diabetes     ALLERGIES:  has no known allergies.  MEDICATIONS:  Current Outpatient Medications  Medication Sig Dispense Refill   ALPRAZolam (XANAX) 0.25 MG tablet Take 0.25 mg by mouth daily as needed for anxiety or sleep.     aspirin 81 MG chewable tablet Chew 81 mg by mouth daily.     atorvastatin (LIPITOR) 10 MG tablet Take 10 mg by mouth daily.     Cholecalciferol (VITAMIN D3) 125 MCG (5000 UT) TABS Take 5,000 Units by mouth daily.     Fish Oil-Krill Oil (KRILL & FISH OIL BLEND PO) Take 1 capsule by mouth daily. Mega Red     fluocinolone (SYNALAR) 0.01 % external solution 5 drops 2 (two) times daily. Ear drops     hydrocortisone cream 1 % Apply 1 Application topically daily as needed (On ear).     Magnesium 400 MG TABS Take 400 mg by mouth daily.     metFORMIN (GLUCOPHAGE) 500 MG tablet Take 500 mg by mouth daily before supper.     Multiple Vitamins-Minerals (MULTIVITAMIN ADULTS 50+ PO) Take 1 tablet by mouth daily. Centrum silver  polyethylene glycol (MIRALAX / GLYCOLAX) 17 g packet Take 17 g by mouth daily.     triamterene-hydrochlorothiazide (DYAZIDE) 37.5-25 MG capsule Take 1 capsule by mouth daily.     verapamil (CALAN-SR) 120 MG CR tablet Take 120 mg by mouth 2 (two) times daily.     No current facility-administered medications for this visit.    SURGICAL HISTORY:  Past Surgical History:  Procedure Laterality Date   CHOLECYSTECTOMY     COLONOSCOPY WITH PROPOFOL N/A 06/01/2022   Procedure: COLONOSCOPY WITH PROPOFOL;  Surgeon: Kerin Salen, MD;   Location: [facility];  Service: Gastroenterology;  Laterality: N/A;   CYSTOSCOPY  10/22/2021   Procedure: CYSTOSCOPY, Urethral dilation, foley placement;  Surgeon: Jerilee Field, MD;  Location: [facility] OR;  Service: Urology;;   ELBOW SURGERY     INTERCOSTAL NERVE BLOCK Left 10/22/2021   Procedure: INTERCOSTAL NERVE BLOCK;  Surgeon: Corliss Skains, MD;  Location: [facility] OR;  Service: Thoracic;  Laterality: Left;   NODE DISSECTION Left 10/22/2021   Procedure: NODE DISSECTION;  Surgeon: Corliss Skains, MD;  Location: [facility] OR;  Service: Thoracic;  Laterality: Left;   POLYPECTOMY  06/01/2022   Procedure: POLYPECTOMY;  Surgeon: Kerin Salen, MD;  Location: [facility];  Service: Gastroenterology;;    REVIEW OF SYSTEMS:  A comprehensive review of systems was negative except for: Respiratory: positive for dyspnea on exertion   PHYSICAL EXAMINATION: General appearance: alert, cooperative, fatigued, and no distress Head: Normocephalic, without obvious abnormality, atraumatic Neck: no adenopathy, no JVD, supple, symmetrical, trachea midline, and thyroid not enlarged, symmetric, no tenderness/mass/nodules Lymph nodes: Cervical, supraclavicular, and axillary nodes normal. Resp: clear to auscultation bilaterally Back: symmetric, no curvature. ROM normal. No CVA tenderness. Cardio: regular rate and rhythm, S1, S2 normal, no murmur, click, rub or gallop GI: soft, non-tender; bowel sounds normal; no masses,  no organomegaly Extremities: extremities normal, atraumatic, no cyanosis or edema  ECOG PERFORMANCE STATUS: 1 - Symptomatic but completely ambulatory  Blood pressure (!) 158/78, pulse 76, temperature 98.1 F (36.7 C), temperature source Temporal, resp. rate 16, height 5\' 1"  (1.549 m), weight 265 lb 1.6 oz (120.2 kg), SpO2 96%.  LABORATORY DATA: Lab Results  Component Value Date   WBC 8.5 06/02/2023   HGB 14.1 06/02/2023   HCT 42.6 06/02/2023   MCV 93.0 06/02/2023   PLT 199 06/02/2023       Chemistry      Component Value Date/Time   NA 140 06/02/2023 0849   K 3.6 06/02/2023 0849   CL 101 06/02/2023 0849   CO2 33 (H) 06/02/2023 0849   BUN 14 06/02/2023 0849   CREATININE 1.05 (H) 06/02/2023 0849      Component Value Date/Time   CALCIUM 10.2 06/02/2023 0849   ALKPHOS 43 06/02/2023 0849   AST 26 06/02/2023 0849   ALT 28 06/02/2023 0849   BILITOT 0.7 06/02/2023 0849       RADIOGRAPHIC STUDIES: CT Chest W Contrast Result Date: 06/09/2023 CLINICAL DATA:  Non-small-cell lung cancer. Restaging. * Tracking Code: BO * EXAM: CT CHEST WITH CONTRAST TECHNIQUE: Multidetector CT imaging of the chest was performed during intravenous contrast administration. RADIATION DOSE REDUCTION: This exam was performed according to the departmental dose-optimization program which includes automated exposure control, adjustment of the mA and/or kV according to patient size and/or use of iterative reconstruction technique. CONTRAST:  75mL OMNIPAQUE IOHEXOL 300 MG/ML  SOLN COMPARISON:  12/03/2022 FINDINGS: Cardiovascular: The heart size is normal. No substantial pericardial effusion. Coronary artery calcification is evident. Mild  atherosclerotic calcification is noted in the wall of the thoracic aorta. Mediastinum/Nodes: No mediastinal lymphadenopathy. There is no hilar lymphadenopathy. The esophagus has normal imaging features. There is no axillary lymphadenopathy. Lungs/Pleura: Surgical changes again noted in the left hilum. No new suspicious pulmonary nodule or mass. No focal airspace consolidation. No pleural effusion. Upper Abdomen: Stable small hypodensity inferior spleen, likely benign. No followup imaging is recommended. Musculoskeletal: No worrisome lytic or sclerotic osseous abnormality. IMPRESSION: 1. Stable exam. No evidence for recurrent or metastatic disease in the chest. 2.  Aortic Atherosclerosis (ICD10-I70.0). Electronically Signed   By: Kennith Center M.D.   On: 06/09/2023 06:42    ASSESSMENT  AND PLAN: This is a very pleasant 73 years old white female with Stage IB (T2 a, N0, M0) non-small cell lung cancer, moderately differentiated invasive squamous cell carcinoma presented with left upper lobe lung nodule status post left upper lobectomy with lymph node sampling under the care of Dr. Cliffton Asters and the final pathology showed 3.5 cm squamous cell carcinoma with visceral pleural involvement diagnosed in June 2023  The patient has been on observation and she is feeling fine with no concerning complaints. She had repeat CT scan of the chest performed recently.  I personally and independently reviewed the scan and discussed the result with the patient and her husband.  Her scan showed no concerning findings for disease recurrence or metastasis.    Stage 1B Non-Small Cell Lung Cancer (NSCLC) - Squamous Cell Carcinoma   Diagnosed in June 2023. Underwent left upper lobectomy by Dr. Cliffton Asters. Currently on observation. Recent scan shows no evidence of recurrent or metastatic disease. Reports feeling well and continues nocturnal oxygen therapy due to hypoxemia during sleep. Discussed transitioning to annual scans after the next six-month scan to reduce scan-related anxiety.   - Continue observation   - Schedule next scan in six months   - Transition to annual scans after next six-month scan    Peripheral Edema   Reports swelling in the left leg and foot, particularly after prolonged sitting. Currently using compression socks to manage symptoms.   - Continue use of compression socks    Prediabetes   Reports elevated blood glucose levels noted during recent lab work. Interested in discussing potential treatment options such as Ozempic with her family doctor. Discussed that Ozempic should not interfere with cancer treatment and may aid in weight management.   - Discuss potential use of Ozempic with family doctor    Polypharmacy   Taking multiple medications and vitamins, leading to concerns about  potential side effects and renal function. Advised to review medication list with family doctor to streamline and reduce unnecessary medications.   - Review and streamline medication list with family doctor    Follow-up   - Ensure scan is scheduled one week before next visit   - Call two weeks before the visit to confirm scan appointment.   The patient was advised to call immediately if she has any concerning symptoms in the interval. The patient voices understanding of current disease status and treatment options and is in agreement with the current care plan.  All questions were answered. The patient knows to call the clinic with any problems, questions or concerns. We can certainly see the patient much sooner if necessary.  The total time spent in the appointment was 20 minutes.  Disclaimer: This note was dictated with voice recognition software. Similar sounding words can inadvertently be transcribed and may not be corrected upon review.

## 2023-07-20 ENCOUNTER — Encounter: Payer: Self-pay | Admitting: Emergency Medicine

## 2023-07-20 ENCOUNTER — Ambulatory Visit: Payer: Medicare Other | Admitting: Emergency Medicine

## 2023-07-20 ENCOUNTER — Ambulatory Visit: Payer: Medicare Other | Admitting: Pulmonary Disease

## 2023-07-20 VITALS — BP 138/76 | HR 86 | Ht 62.0 in | Wt 264.0 lb

## 2023-07-20 DIAGNOSIS — C3412 Malignant neoplasm of upper lobe, left bronchus or lung: Secondary | ICD-10-CM

## 2023-07-20 DIAGNOSIS — G4734 Idiopathic sleep related nonobstructive alveolar hypoventilation: Secondary | ICD-10-CM | POA: Insufficient documentation

## 2023-07-20 DIAGNOSIS — J449 Chronic obstructive pulmonary disease, unspecified: Secondary | ICD-10-CM | POA: Diagnosis not present

## 2023-07-20 DIAGNOSIS — J301 Allergic rhinitis due to pollen: Secondary | ICD-10-CM

## 2023-07-20 DIAGNOSIS — R053 Chronic cough: Secondary | ICD-10-CM

## 2023-07-20 DIAGNOSIS — J309 Allergic rhinitis, unspecified: Secondary | ICD-10-CM | POA: Insufficient documentation

## 2023-07-20 DIAGNOSIS — R059 Cough, unspecified: Secondary | ICD-10-CM | POA: Insufficient documentation

## 2023-07-20 NOTE — Assessment & Plan Note (Signed)
 She has obstruction on her pulmonary function testing, does have dyspnea with climbing stairs.  Does not use her albuterol.  Will continue to keep it available.  We could consider empiric trial of long-acting BD at some point going forward.

## 2023-07-20 NOTE — Assessment & Plan Note (Signed)
 Reassuring CT chest in January.  She is getting her follow-up CTs, surveillance with Dr. Arbutus Ped

## 2023-07-20 NOTE — Assessment & Plan Note (Signed)
 Contributing to cough.  She is not on any therapy.  Will try starting loratadine, consider adding additional therapy going forward depending on response.

## 2023-07-20 NOTE — Progress Notes (Signed)
 Subjective:    Patient ID: Kathryn [name], female    DOB: Mar 08, 1951, 73 y.o.   MRN: [id number]  HPI 73 year old woman, history of tobacco use and associated COPD, hypertension, GERD, diabetes.  She is post left upper lobe lobectomy for squamous cell lung cancer and is on observation, being followed by Dr. Shirline Frees with oncology.  She has documented nocturnal hypoxemia based on overnight oximetry that showed desaturation March 2024. She has albuterol available to use as needed, is not on any scheduled BD therapy currently. She has been dealing with allergy symptoms, describes some intermittent cough, UA irritation. She has some allergy congestion. Unclear whether she has GERD, nothing overt.  She has albuterol - uses almost never. She is SOB w stairs.  She is wearing 2L/min at bedtime. Per her husband she snores. No witnessed apneas.   Pulmonary function testing 09/17/2021 reviewed by me shows evidence for mild to moderate mixed obstruction with restriction on spirometry.  No bronchodilator response.  Her FEV1 is 1.58 L (79% predicted).  Diffusion capacity is normal.  CT chest 06/02/2023 reviewed by me showed no mediastinal or hilar adenopathy, stable surgical changes in the left hilum without any evidence of any new nodules or suspicious lesions.   Review of Systems As per HPI  Past Medical History:  Diagnosis Date   Anxiety    Depression    Diabetes (HCC)    Dyspnea    GERD (gastroesophageal reflux disease)    HTN (hypertension)    Hyperglycemia    Pre-diabetes      Family History  Problem Relation Age of Onset   Heart failure Mother    Coronary artery disease Sister 13     Social History   Socioeconomic History   Marital status: Married    Spouse name: Not on file   Number of children: Not on file   Years of education: Not on file   Highest education level: Not on file  Occupational History   Not on file  Tobacco Use   Smoking status: Former    Current packs/day:  0.00    Types: Cigarettes    Quit date: 2019    Years since quitting: 6.1   Smokeless tobacco: Never  Vaping Use   Vaping status: Never Used  Substance and Sexual Activity   Alcohol use: Yes    Comment: social   Drug use: Yes    Types: Marijuana    Comment: Smokes marijuana several times a week   Sexual activity: Not on file  Other Topics Concern   Not on file  Social History Narrative   Lives with her husband.  Together they have six children.   Social Drivers of Corporate investment banker Strain: Not on file  Food Insecurity: No Food Insecurity (10/24/2021)   Hunger Vital Sign    Worried About Running Out of Food in the Last Year: Never true    Ran Out of Food in the Last Year: Never true  Transportation Needs: No Transportation Needs (10/24/2021)   PRAPARE - Administrator, Civil Service (Medical): No    Lack of Transportation (Non-Medical): No  Physical Activity: Not on file  Stress: Not on file  Social Connections: Unknown (09/14/2021)   Received from [facility]   Social Network    Social Network: Not on file  Intimate Partner Violence: Unknown (08/17/2021)   Received from [facility], [facility]   HITS    Physically Hurt: Not on  file    Insult or Talk Down To: Not on file    Threaten Physical Harm: Not on file    Scream or Curse: Not on file     No Known Allergies   Outpatient Medications Prior to Visit  Medication Sig Dispense Refill   albuterol (VENTOLIN HFA) 108 (90 Base) MCG/ACT inhaler Inhale 1 puff into the lungs every 4 (four) hours as needed.     ALPRAZolam (XANAX) 0.25 MG tablet Take 0.25 mg by mouth daily as needed for anxiety or sleep.     Ascorbic Acid (VITAMIN C) 1000 MG tablet Take 1,000 mg by mouth daily.     aspirin 81 MG chewable tablet Chew 81 mg by mouth daily.     atorvastatin (LIPITOR) 10 MG tablet Take 10 mg by mouth daily.     Cholecalciferol (VITAMIN D3) 125 MCG (5000 UT) TABS Take 5,000 Units by mouth  daily.     Fish Oil-Krill Oil (KRILL & FISH OIL BLEND PO) Take 1 capsule by mouth daily. Mega Red     fluocinolone (SYNALAR) 0.01 % external solution 5 drops 2 (two) times daily. Ear drops     hydrocortisone cream 1 % Apply 1 Application topically daily as needed (On ear).     Magnesium 400 MG TABS Take 400 mg by mouth daily.     metFORMIN (GLUCOPHAGE) 500 MG tablet Take 500 mg by mouth daily before supper.     Multiple Vitamins-Minerals (MULTIVITAMIN ADULTS 50+ PO) Take 1 tablet by mouth daily. Centrum silver     polyethylene glycol (MIRALAX / GLYCOLAX) 17 g packet Take 17 g by mouth daily.     triamterene-hydrochlorothiazide (DYAZIDE) 37.5-25 MG capsule Take 1 capsule by mouth daily.     verapamil (CALAN-SR) 120 MG CR tablet Take 120 mg by mouth 2 (two) times daily.     No facility-administered medications prior to visit.         Objective:   Physical Exam  Vitals:   07/20/23 1254 07/20/23 1256  BP: (!) 171/77 138/76  Pulse: 86   SpO2: 93%   Weight: 264 lb (119.7 kg)   Height: 5\' 2"  (1.575 m)    Gen: Pleasant, obese, in no distress,  normal affect  ENT: No lesions,  mouth clear,  oropharynx clear, no postnasal drip  Neck: No JVD, no stridor  Lungs: No use of accessory muscles, no crackles or wheezing on normal respiration, no wheeze on forced expiration  Cardiovascular: RRR, heart sounds normal, no murmur or gallops, no peripheral edema  Musculoskeletal: No deformities, no cyanosis or clubbing  Neuro: alert, awake, non focal  Skin: Warm, no lesions or rash      Assessment & Plan:   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  She is getting her follow-up CTs, surveillance with Dr. Arbutus Ped  Nocturnal hypoxemia She has a contribution of hypoventilation due to her weight but I suspect she also has obstructive sleep apnea.  We will perform PSG to further evaluate  Continue wearing your oxygen 2 L/min at night  while sleeping We will arrange for a home sleep test to screen for possible obstructive sleep apnea.  This test will be done off of your oxygen   COPD (chronic obstructive pulmonary disease) (HCC) She has obstruction on her pulmonary function testing, does have dyspnea with climbing stairs.  Does not use her albuterol.  Will continue to keep it available.  We could consider empiric  trial of long-acting BD at some point going forward.  Allergic rhinitis Contributing to cough.  She is not on any therapy.  Will try starting loratadine, consider adding additional therapy going forward depending on response.  Cough Likely multifactorial.  Will try treating allergic rhinitis since this appears to be the most overt contributor.  She has been tried empirically on PPI in the past after an ENT evaluation showed upper airway inflammation.  She is not feeling much GERD.  We could consider retrial going forward depending on how the cough persists   Levy Pupa, MD, PhD 07/20/2023, 1:31 PM [facility] [phone] or if no answer before 7:00PM call [phone] For any issues after 7:00PM please call eLink [phone]

## 2023-07-20 NOTE — Patient Instructions (Signed)
 It was a pleasure to meet you today Continue wearing your oxygen 2 L/min at night while sleeping We will arrange for a home sleep test to screen for possible obstructive sleep apnea.  This test will be done off of your oxygen Try starting loratadine 10 mg (generic Claritin) once daily to see if this helps with congestion and cough Keep your albuterol available to use 2 puffs to be needed for shortness of breath, chest tightness, wheezing. Continue follow-up with Dr. Arbutus Ped with oncology.  Get your surveillance CT scans of the chest as per his plans. Follow in our office in about 2 months to review your home sleep test

## 2023-07-20 NOTE — Assessment & Plan Note (Signed)
 She has a contribution of hypoventilation due to her weight but I suspect she also has obstructive sleep apnea.  We will perform PSG to further evaluate  Continue wearing your oxygen 2 L/min at night while sleeping We will arrange for a home sleep test to screen for possible obstructive sleep apnea.  This test will be done off of your oxygen

## 2023-07-20 NOTE — Assessment & Plan Note (Signed)
 Likely multifactorial.  Will try treating allergic rhinitis since this appears to be the most overt contributor.  She has been tried empirically on PPI in the past after an ENT evaluation showed upper airway inflammation.  She is not feeling much GERD.  We could consider retrial going forward depending on how the cough persists

## 2023-08-04 ENCOUNTER — Encounter

## 2023-08-04 DIAGNOSIS — G4734 Idiopathic sleep related nonobstructive alveolar hypoventilation: Secondary | ICD-10-CM

## 2023-09-01 ENCOUNTER — Telehealth: Payer: Self-pay | Admitting: Pulmonary Disease

## 2023-09-01 DIAGNOSIS — G4733 Obstructive sleep apnea (adult) (pediatric): Secondary | ICD-10-CM | POA: Diagnosis not present

## 2023-09-01 NOTE — Telephone Encounter (Signed)
 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

## 2023-09-07 NOTE — Telephone Encounter (Signed)
 She has an office visit with us  on 09/19/2023 at which time I think we should talk about possibly starting CPAP treatment.

## 2023-09-19 ENCOUNTER — Ambulatory Visit: Admitting: Nurse Practitioner

## 2023-09-19 ENCOUNTER — Encounter: Payer: Self-pay | Admitting: Nurse Practitioner

## 2023-09-19 VITALS — BP 136/82 | HR 70 | Wt 261.4 lb

## 2023-09-19 DIAGNOSIS — G4733 Obstructive sleep apnea (adult) (pediatric): Secondary | ICD-10-CM

## 2023-09-19 DIAGNOSIS — G4734 Idiopathic sleep related nonobstructive alveolar hypoventilation: Secondary | ICD-10-CM

## 2023-09-19 DIAGNOSIS — Z6841 Body Mass Index (BMI) 40.0 and over, adult: Secondary | ICD-10-CM

## 2023-09-19 DIAGNOSIS — E66813 Obesity, class 3: Secondary | ICD-10-CM

## 2023-09-19 DIAGNOSIS — J449 Chronic obstructive pulmonary disease, unspecified: Secondary | ICD-10-CM

## 2023-09-19 DIAGNOSIS — G4730 Sleep apnea, unspecified: Secondary | ICD-10-CM

## 2023-09-19 NOTE — Assessment & Plan Note (Addendum)
 Moderate OSA with AHI 24.2/h, SpO2 low 70%. Currently on nocturnal oxygen . Recommended in lab CPAP titration study given history of nocturnal hypoxia; however given current life events, she is unsure she would be able to get this done in the next few months. Discussed risks of untreated OSA and potential treatment options.  Recommendation was made for CPAP given severity of sleep apnea.  Educated on proper use/care device.  Were/benefits reviewed.  Healthy weight loss encouraged.  Safe driving practices reviewed.  We will still place orders for CPAP titration study to ensure that her nocturnal hypoxia is corrected with CPAP use; advised that this would likely be in the next 8 to 10 weeks, which she felt would be a possibility for her to be able to complete.  She will notify us  if she has any issues in the meantime. Orders placed for nasal mask as she is claustrophobic  Patient Instructions  Start CPAP auto 5-15 cmH2O, nasal mask and heated humidity, every night, minimum of 4-6 hours a night.  Change equipment as directed. Wash your tubing with warm soap and water daily, hang to dry. Wash humidifier portion weekly. Use bottled, distilled water and change daily Be aware of reduced alertness and do not drive or operate heavy machinery if experiencing this or drowsiness.  Exercise encouraged, as tolerated. Healthy weight management discussed.  Avoid or decrease alcohol consumption and medications that make you more sleepy, if possible. Notify if persistent daytime sleepiness occurs even with consistent use of PAP therapy.  I put orders in for a DreamWear nasal cradle mask or an AirFit N30i  We discussed how untreated sleep apnea puts an individual at risk for cardiac arrhthymias, pulm HTN, DM, stroke and increases their risk for daytime accidents. We also briefly reviewed treatment options including weight loss, side sleeping position, oral appliance, CPAP therapy or referral to ENT for possible surgical  options Given the severity of your sleep apnea, the recommendation is for CPAP therapy while working on weight loss measures  Change supplies... Every month Mask cushions and/or nasal pillows CPAP machine filters Every 3 months Mask frame (not including the headgear) CPAP tubing Every 6 months Mask headgear Chin strap (if applicable) Humidifier water tub  Continue to use your oxygen  2 lpm at night until you start CPAP Once you're on CPAP, we will have you do an oxygen  study either at home or in the lab to ensure your oxygen  levels are corrected.   Continue Albuterol  inhaler 2 puffs every 6 hours as needed for shortness of breath or wheezing. Notify if symptoms persist despite rescue inhaler/neb use.   Attend CT chest as scheduled by Dr. Marguerita Shih  Follow up in 10-12 weeks with Katie Vonette Grosso,NP to see how CPAP is going. Follow up with Dr. Baldwin Levee in 6 months. If symptoms do not improve or worsen, please contact office for sooner follow up or seek emergency care.

## 2023-09-19 NOTE — Assessment & Plan Note (Signed)
 Continue supplemental oxygen  2 lpm in interim. See above.

## 2023-09-19 NOTE — Patient Instructions (Addendum)
 Start CPAP auto 5-15 cmH2O, nasal mask and heated humidity, every night, minimum of 4-6 hours a night.  Change equipment as directed. Wash your tubing with warm soap and water daily, hang to dry. Wash humidifier portion weekly. Use bottled, distilled water and change daily Be aware of reduced alertness and do not drive or operate heavy machinery if experiencing this or drowsiness.  Exercise encouraged, as tolerated. Healthy weight management discussed.  Avoid or decrease alcohol consumption and medications that make you more sleepy, if possible. Notify if persistent daytime sleepiness occurs even with consistent use of PAP therapy.  I put orders in for a DreamWear nasal cradle mask or an AirFit N30i  We discussed how untreated sleep apnea puts an individual at risk for cardiac arrhthymias, pulm HTN, DM, stroke and increases their risk for daytime accidents. We also briefly reviewed treatment options including weight loss, side sleeping position, oral appliance, CPAP therapy or referral to ENT for possible surgical options Given the severity of your sleep apnea, the recommendation is for CPAP therapy while working on weight loss measures  Change supplies... Every month Mask cushions and/or nasal pillows CPAP machine filters Every 3 months Mask frame (not including the headgear) CPAP tubing Every 6 months Mask headgear Chin strap (if applicable) Humidifier water tub  Continue to use your oxygen  2 lpm at night until you start CPAP Once you're on CPAP, we will have you do an oxygen  study either at home or in the lab to ensure your oxygen  levels are corrected.   Continue Albuterol  inhaler 2 puffs every 6 hours as needed for shortness of breath or wheezing. Notify if symptoms persist despite rescue inhaler/neb use.   Attend CT chest as scheduled by Dr. Marguerita Shih  Follow up in 10-12 weeks with Kathryn Kyoko Elsea,Kathryn [name] to see how CPAP is going. Follow up with Dr. Baldwin Levee in 6 months. If symptoms do not  improve or worsen, please contact office for sooner follow up or seek emergency care.

## 2023-09-19 NOTE — Assessment & Plan Note (Signed)
 BMI 47. Healthy weight loss encouraged. Recently started on GLP 1 for weight management.

## 2023-09-19 NOTE — Assessment & Plan Note (Signed)
 Mild. Stable dyspnea. Overall low symptom burden. Continue SABA PRN. Action plan in place.

## 2023-09-19 NOTE — Progress Notes (Signed)
 @Patient  ID: Kathryn [name], female    DOB: 03-Mar-1951, 72 y.o.   MRN: [id number]  Chief Complaint  Patient presents with   Follow-up    Patient states her oxygen  level drops at night. Was placed on oxygen  a year ago after surgery.    Referring provider: Therman Flatter  HPI: 73 year old female, former smoker COPD, history of squamous cell lung cancer status post left upper lobectomy, nocturnal hypoxemia.  She is a patient of Dr. Lacinda Pica and last seen in office 07/20/2023.  Past medical history significant for allergic rhinitis, HLD, anxiety, obesity, hypertension, diabetes, GERD.  TEST/EVENTS:  09/17/2021 PFT: FVC 77, FEV1 79, ratio 74, DLCO 97 06/02/2023 CT chest: No LAD.  Mild atherosclerosis.  Postsurgical changes in the left hilum. 08/04/2023 HST: AHI 24.2, SpO2 low 70%  07/20/2023: OV with Dr. Baldwin Levee.  Has albuterol  available to use as needed but has not  required this.  Not on any scheduled BD therapy.  Dealing with allergy symptoms, describes some intermittent cough and upper airway irritation.  Has shortness of breath with stairs.  Wearing 2 L supplemental O2 at bedtime.  Has been decided that she snores.  Sleep study ordered for further evaluation to evaluate for OSA/OHS.  Could consider long-acting bronchodilators in the future if she has any issues with her breathing.  Suspect that her cough is multifactorial.  Try treating allergic rhinitis and see how she responds to this.  Has been seen by ENT in the past with evaluation that showed upper airway inflammation.  She was on PPI.  No overt GERD symptoms.  Followed by Dr. Liam Redhead with stable prior CT.  09/19/2023: Today-follow-up Patient presents today with her husband for follow-up.  She had a home sleep study that revealed moderate sleep apnea.  She has been using supplemental oxygen  at night for about a year now.  She does have snoring and excessive daytime fatigue symptoms.  Feels like she is tired all the time.  No issues with  drowsy driving, sleep parasomnia/paralysis, morning headaches. Her husband was recently diagnosed with prostate cancer. He is preparing to have radioactive seed implant and 5 cycles of radiation.  Her breathing is stable. Has an occasional cough that is minimally productive. No worsening. Doesn't have to use her rescue inhaler. No cough or chest congestion.   No Known Allergies  Immunization History  Administered Date(s) Administered   Influenza Split 01/29/2008   Influenza, High Dose Seasonal PF 03/23/2016, 02/11/2017, 03/09/2018, 02/28/2019, 02/08/2020, 02/10/2021   Influenza,inj,quad, With Preservative 03/05/2015   Influenza-Unspecified 02/08/2020   PFIZER(Purple Top)SARS-COV-2 Vaccination 07/12/2019, 08/07/2019, 03/10/2020   Pneumococcal Conjugate-13 03/23/2016   Pneumococcal Polysaccharide-23 02/24/2018   Pneumococcal-Unspecified 02/24/2018   Tdap 08/12/2021   Zoster, Live 11/07/2018, 02/28/2019    Past Medical History:  Diagnosis Date   Anxiety    Depression    Diabetes (HCC)    Dyspnea    GERD (gastroesophageal reflux disease)    HTN (hypertension)    Hyperglycemia    Pre-diabetes     Tobacco History: Social History   Tobacco Use  Smoking Status Former   Current packs/day: 0.00   Types: Cigarettes   Quit date: 2019   Years since quitting: 6.3  Smokeless Tobacco Never   Counseling given: Not Answered   Outpatient Medications Prior to Visit  Medication Sig Dispense Refill   albuterol  (VENTOLIN  HFA) 108 (90 Base) MCG/ACT inhaler Inhale 1 puff into the lungs every 4 (four) hours as needed.     ALPRAZolam  (  XANAX ) 0.25 MG tablet Take 0.25 mg by mouth daily as needed for anxiety or sleep.     Ascorbic Acid (VITAMIN C) 1000 MG tablet Take 1,000 mg by mouth daily.     aspirin  81 MG chewable tablet Chew 81 mg by mouth daily.     atorvastatin  (LIPITOR) 10 MG tablet Take 10 mg by mouth daily.     Cholecalciferol (VITAMIN D3) 125 MCG (5000 UT) TABS Take 5,000 Units by  mouth daily.     Fish Oil-Krill Oil (KRILL & FISH OIL BLEND PO) Take 1 capsule by mouth daily. Mega Red     fluocinolone (SYNALAR) 0.01 % external solution 5 drops 2 (two) times daily. Ear drops     Magnesium  400 MG TABS Take 400 mg by mouth daily.     metFORMIN (GLUCOPHAGE) 500 MG tablet Take 500 mg by mouth daily before supper.     Multiple Vitamins-Minerals (MULTIVITAMIN ADULTS 50+ PO) Take 1 tablet by mouth daily. Centrum silver     OZEMPIC, 0.25 OR 0.5 MG/DOSE, 2 MG/3ML SOPN inject 0.25mg  once weekly for 4 weeks then increase to 0.5 MG Subcutaneous once a week for 90 days     polyethylene glycol (MIRALAX  / GLYCOLAX ) 17 g packet Take 17 g by mouth daily.     triamterene -hydrochlorothiazide  (DYAZIDE ) 37.5-25 MG capsule Take 1 capsule by mouth daily.     verapamil  (CALAN -SR) 120 MG CR tablet Take 120 mg by mouth 2 (two) times daily.     hydrocortisone cream 1 % Apply 1 Application topically daily as needed (On ear).     No facility-administered medications prior to visit.     Review of Systems:   Constitutional: No weight loss or gain, night sweats, fevers, chills,or lassitude. +fatigue  HEENT: No headaches, difficulty swallowing, tooth/dental problems, or sore throat. No sneezing, itching, ear ache, nasal congestion, or post nasal drip CV:  No chest pain, orthopnea, PND, swelling in lower extremities, anasarca, dizziness, palpitations, syncope Resp: +snoring; stable shortness of breath with exertion. No excess mucus or change in color of mucus. No productive or non-productive. No hemoptysis. No wheezing.  No chest wall deformity GI:  No heartburn, indigestion, abdominal pain, nausea, vomiting, diarrhea, change in bowel habits, loss of appetite, bloody stools.  GU: No dysuria, change in color of urine, urgency or frequency.   Skin: No rash, lesions, ulcerations MSK:  No joint pain or swelling.   Neuro: No dizziness or lightheadedness.  Psych: No depression or anxiety. Mood stable. +sleep  disturbance     Physical Exam:  BP 136/82 (BP Location: Right Arm, Patient Position: Sitting, Cuff Size: Large)   Pulse 70   Wt 261 lb 6.4 oz (118.6 kg)   SpO2 93%   BMI 47.81 kg/m   GEN: Pleasant, interactive, well-appearing; morbidly obese; in no acute distress HEENT:  Normocephalic and atraumatic. PERRLA. Sclera white. Nasal turbinates pink, moist and patent bilaterally. No rhinorrhea present. Oropharynx pink and moist, without exudate or edema. No lesions, ulcerations, or postnasal drip. Mallampati III/IV NECK:  Supple w/ fair ROM. Thyroid symmetrical with no goiter or nodules palpated. No lymphadenopathy.   CV: RRR, no m/r/g, no peripheral edema. Pulses intact, +2 bilaterally. No cyanosis, pallor or clubbing. PULMONARY:  Unlabored, regular breathing. Clear bilaterally A&P w/o wheezes/rales/rhonchi. No accessory muscle use.  GI: BS present and normoactive. Soft, non-tender to palpation. No organomegaly or masses detected.  MSK: No erythema, warmth or tenderness. Cap refil <2 sec all extrem. No deformities or joint swelling noted.  Neuro: A/Ox3. No focal deficits noted.   Skin: Warm, no lesions or rashe Psych: Normal affect and behavior. Judgement and thought content appropriate.     Lab Results:  CBC    Component Value Date/Time   WBC 8.5 06/02/2023 0849   WBC 10.1 10/24/2021 0535   RBC 4.58 06/02/2023 0849   HGB 14.1 06/02/2023 0849   HCT 42.6 06/02/2023 0849   PLT 199 06/02/2023 0849   MCV 93.0 06/02/2023 0849   MCH 30.8 06/02/2023 0849   MCHC 33.1 06/02/2023 0849   RDW 12.7 06/02/2023 0849   LYMPHSABS 2.4 06/02/2023 0849   MONOABS 0.6 06/02/2023 0849   EOSABS 0.2 06/02/2023 0849   BASOSABS 0.0 06/02/2023 0849    BMET    Component Value Date/Time   NA 140 06/02/2023 0849   K 3.6 06/02/2023 0849   CL 101 06/02/2023 0849   CO2 33 (H) 06/02/2023 0849   GLUCOSE 162 (H) 06/02/2023 0849   BUN 14 06/02/2023 0849   CREATININE 1.05 (H) 06/02/2023 0849   CALCIUM   10.2 06/02/2023 0849   GFRNONAA 56 (L) 06/02/2023 0849    BNP No results found for: "BNP"   Imaging:  No results found.  Administration History     None          Latest Ref Rng & Units 09/17/2021    9:29 AM  PFT Results  FVC-Pre L 2.05   FVC-Predicted Pre % 77   FVC-Post L 2.05   FVC-Predicted Post % 77   Pre FEV1/FVC % % 77   Post FEV1/FCV % % 74   FEV1-Pre L 1.58   FEV1-Predicted Pre % 79   FEV1-Post L 1.52   DLCO uncorrected ml/min/mmHg 18.12   DLCO UNC% % 103   DLCO corrected ml/min/mmHg 17.15   DLCO COR %Predicted % 97   DLVA Predicted % 110     No results found for: "NITRICOXIDE"      Assessment & Plan:   Obstructive sleep apnea Moderate OSA with AHI 24.2/h, SpO2 low 70%. Currently on nocturnal oxygen . Recommended in lab CPAP titration study given history of nocturnal hypoxia; however given current life events, she is unsure she would be able to get this done in the next few months. Discussed risks of untreated OSA and potential treatment options.  Recommendation was made for CPAP given severity of sleep apnea.  Educated on proper use/care device.  Were/benefits reviewed.  Healthy weight loss encouraged.  Safe driving practices reviewed.  We will still place orders for CPAP titration study to ensure that her nocturnal hypoxia is corrected with CPAP use; advised that this would likely be in the next 8 to 10 weeks, which she felt would be a possibility for her to be able to complete.  She will notify us  if she has any issues in the meantime. Orders placed for nasal mask as she is claustrophobic  Patient Instructions  Start CPAP auto 5-15 cmH2O, nasal mask and heated humidity, every night, minimum of 4-6 hours a night.  Change equipment as directed. Wash your tubing with warm soap and water daily, hang to dry. Wash humidifier portion weekly. Use bottled, distilled water and change daily Be aware of reduced alertness and do not drive or operate heavy machinery if  experiencing this or drowsiness.  Exercise encouraged, as tolerated. Healthy weight management discussed.  Avoid or decrease alcohol consumption and medications that make you more sleepy, if possible. Notify if persistent daytime sleepiness occurs even with consistent use of PAP  therapy.  I put orders in for a DreamWear nasal cradle mask or an AirFit N30i  We discussed how untreated sleep apnea puts an individual at risk for cardiac arrhthymias, pulm HTN, DM, stroke and increases their risk for daytime accidents. We also briefly reviewed treatment options including weight loss, side sleeping position, oral appliance, CPAP therapy or referral to ENT for possible surgical options Given the severity of your sleep apnea, the recommendation is for CPAP therapy while working on weight loss measures  Change supplies... Every month Mask cushions and/or nasal pillows CPAP machine filters Every 3 months Mask frame (not including the headgear) CPAP tubing Every 6 months Mask headgear Chin strap (if applicable) Humidifier water tub  Continue to use your oxygen  2 lpm at night until you start CPAP Once you're on CPAP, we will have you do an oxygen  study either at home or in the lab to ensure your oxygen  levels are corrected.   Continue Albuterol  inhaler 2 puffs every 6 hours as needed for shortness of breath or wheezing. Notify if symptoms persist despite rescue inhaler/neb use.   Attend CT chest as scheduled by Dr. Marguerita Shih  Follow up in 10-12 weeks with Katie Sofiah Lyne,NP to see how CPAP is going. Follow up with Dr. Baldwin Levee in 6 months. If symptoms do not improve or worsen, please contact office for sooner follow up or seek emergency care.    Nocturnal hypoxemia Continue supplemental oxygen  2 lpm in interim. See above.   COPD (chronic obstructive pulmonary disease) (HCC) Mild. Stable dyspnea. Overall low symptom burden. Continue SABA PRN. Action plan in place.   Class 3 severe obesity with body  mass index (BMI) of 45.0 to 49.9 in adult BMI 47. Healthy weight loss encouraged. Recently started on GLP 1 for weight management.    Advised if symptoms do not improve or worsen, to please contact office for sooner follow up or seek emergency care.   I spent 35 minutes of dedicated to the care of this patient on the date of this encounter to include pre-visit review of records, face-to-face time with the patient discussing conditions above, post visit ordering of testing, clinical documentation with the electronic health record, making appropriate referrals as documented, and communicating necessary findings to members of the patients care team.  Roetta Clarke, NP 09/19/2023  Pt aware and understands NP's role.

## 2023-09-26 ENCOUNTER — Telehealth: Payer: Self-pay | Admitting: Student

## 2023-09-26 NOTE — Telephone Encounter (Signed)
 Rc'd CMN from [employer] for CPAP supplies. Will fwd to Ms. Cobb for signature.

## 2023-09-26 NOTE — Telephone Encounter (Signed)
 Rc'd secondary fax. This one to change the pressure on the CPA (The other CMN was for CPAP supplies.). Will fwd to Ms. Cobb for signature.

## 2023-10-07 NOTE — Telephone Encounter (Signed)
 CMN faxed successfully and signed.

## 2023-11-29 ENCOUNTER — Inpatient Hospital Stay: Attending: Internal Medicine

## 2023-11-29 ENCOUNTER — Encounter (HOSPITAL_BASED_OUTPATIENT_CLINIC_OR_DEPARTMENT_OTHER): Admitting: Pulmonary Disease

## 2023-11-29 ENCOUNTER — Other Ambulatory Visit: Payer: Medicare Other

## 2023-11-29 ENCOUNTER — Ambulatory Visit (HOSPITAL_COMMUNITY)
Admission: RE | Admit: 2023-11-29 | Discharge: 2023-11-29 | Disposition: A | Discharge status: Home / Self Care | Source: Ambulatory Visit | Attending: Internal Medicine | Admitting: Internal Medicine

## 2023-11-29 DIAGNOSIS — C3490 Malignant neoplasm of unspecified part of unspecified bronchus or lung: Secondary | ICD-10-CM | POA: Insufficient documentation

## 2023-11-29 DIAGNOSIS — Z902 Acquired absence of lung [part of]: Secondary | ICD-10-CM | POA: Diagnosis not present

## 2023-11-29 DIAGNOSIS — C3412 Malignant neoplasm of upper lobe, left bronchus or lung: Secondary | ICD-10-CM | POA: Insufficient documentation

## 2023-11-29 DIAGNOSIS — G4730 Sleep apnea, unspecified: Secondary | ICD-10-CM | POA: Insufficient documentation

## 2023-11-29 DIAGNOSIS — Z87891 Personal history of nicotine dependence: Secondary | ICD-10-CM | POA: Diagnosis not present

## 2023-11-29 DIAGNOSIS — J449 Chronic obstructive pulmonary disease, unspecified: Secondary | ICD-10-CM | POA: Insufficient documentation

## 2023-11-29 LAB — CMP (CANCER CENTER ONLY)
ALT: 35 U/L (ref 0–44)
AST: 28 U/L (ref 15–41)
Albumin: 4.1 g/dL (ref 3.5–5.0)
Alkaline Phosphatase: 35 U/L — ABNORMAL LOW (ref 38–126)
Anion gap: 5 (ref 5–15)
BUN: 18 mg/dL (ref 8–23)
CO2: 34 mmol/L — ABNORMAL HIGH (ref 22–32)
Calcium: 9.8 mg/dL (ref 8.9–10.3)
Chloride: 103 mmol/L (ref 98–111)
Creatinine: 1.1 mg/dL — ABNORMAL HIGH (ref 0.44–1.00)
GFR, Estimated: 53 mL/min — ABNORMAL LOW (ref >60)
Glucose, Bld: 96 mg/dL (ref 70–99)
Potassium: 3.6 mmol/L (ref 3.5–5.1)
Sodium: 142 mmol/L (ref 135–145)
Total Bilirubin: 0.7 mg/dL (ref 0.0–1.2)
Total Protein: 6.6 g/dL (ref 6.5–8.1)

## 2023-11-29 LAB — CBC WITH DIFFERENTIAL (CANCER CENTER ONLY)
Abs Immature Granulocytes: 0.01 K/uL (ref 0.00–0.07)
Basophils Absolute: 0 K/uL (ref 0.0–0.1)
Basophils Relative: 1 %
Eosinophils Absolute: 0.2 K/uL (ref 0.0–0.5)
Eosinophils Relative: 3 %
HCT: 42.2 % (ref 36.0–46.0)
Hemoglobin: 14.3 g/dL (ref 12.0–15.0)
Immature Granulocytes: 0 %
Lymphocytes Relative: 31 %
Lymphs Abs: 1.7 K/uL (ref 0.7–4.0)
MCH: 31 pg (ref 26.0–34.0)
MCHC: 33.9 g/dL (ref 30.0–36.0)
MCV: 91.3 fL (ref 80.0–100.0)
Monocytes Absolute: 0.4 K/uL (ref 0.1–1.0)
Monocytes Relative: 7 %
Neutro Abs: 3.2 K/uL (ref 1.7–7.7)
Neutrophils Relative %: 58 %
Platelet Count: 188 K/uL (ref 150–400)
RBC: 4.62 MIL/uL (ref 3.87–5.11)
RDW: 13.2 % (ref 11.5–15.5)
WBC Count: 5.5 K/uL (ref 4.0–10.5)
nRBC: 0 % (ref 0.0–0.2)

## 2023-11-29 MED ORDER — IOHEXOL 300 MG/ML  SOLN
60.0000 mL | Freq: Once | INTRAMUSCULAR | Status: AC | PRN
  Administered 2023-11-29: 60 mL via INTRAVENOUS

## 2023-12-01 ENCOUNTER — Ambulatory Visit: Admitting: Nurse Practitioner

## 2023-12-01 ENCOUNTER — Encounter: Payer: Self-pay | Admitting: Nurse Practitioner

## 2023-12-01 VITALS — BP 130/80 | Ht 66.0 in | Wt 200.4 lb

## 2023-12-01 DIAGNOSIS — G4734 Idiopathic sleep related nonobstructive alveolar hypoventilation: Secondary | ICD-10-CM

## 2023-12-01 DIAGNOSIS — G4730 Sleep apnea, unspecified: Secondary | ICD-10-CM

## 2023-12-01 DIAGNOSIS — G4733 Obstructive sleep apnea (adult) (pediatric): Secondary | ICD-10-CM | POA: Diagnosis not present

## 2023-12-01 DIAGNOSIS — J449 Chronic obstructive pulmonary disease, unspecified: Secondary | ICD-10-CM

## 2023-12-01 DIAGNOSIS — C3412 Malignant neoplasm of upper lobe, left bronchus or lung: Secondary | ICD-10-CM

## 2023-12-01 NOTE — Assessment & Plan Note (Signed)
 Continue supplemental oxygen  2 lpm in interim. See above.

## 2023-12-01 NOTE — Progress Notes (Signed)
 @Patient  ID: Kathryn [name], female    DOB: 14-Jan-1951, 73 y.o.   MRN: [id number]  Chief Complaint  Patient presents with   Sleep Apnea    Patient states she's on oxygen  and can't use cpap with it. Started taking ozempic 3 months ago.    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  She is a patient of Dr. Lanny and last seen in office 09/19/2023 by [facility] NP.  Past medical history significant for allergic rhinitis, HLD, anxiety, obesity, hypertension, diabetes, GERD.  TEST/EVENTS:  09/17/2021 PFT: FVC 77, FEV1 79, ratio 74, DLCO 97 06/02/2023 CT chest: No LAD.  Mild atherosclerosis.  Postsurgical changes in the left hilum. 08/04/2023 HST: AHI 24.2, SpO2 low 70%  07/20/2023: OV with Dr. Shelah.  Has albuterol  available to use as needed but has not  required this.  Not on any scheduled BD therapy.  Dealing with allergy symptoms, describes some intermittent cough and upper airway irritation.  Has shortness of breath with stairs.  Wearing 2 L supplemental O2 at bedtime.  Has been decided that she snores.  Sleep study ordered for further evaluation to evaluate for OSA/OHS.  Could consider long-acting bronchodilators in the future if she has any issues with her breathing.  Suspect that her cough is multifactorial.  Try treating allergic rhinitis and see how she responds to this.  Has been seen by ENT in the past with evaluation that showed upper airway inflammation.  She was on PPI.  No overt GERD symptoms.  Followed by Dr. Gatha with stable prior CT.  09/19/2023: OV with Dessa Ledee NP Patient presents today with her husband for follow-up.  She had a home sleep study that revealed moderate sleep apnea.  She has been using supplemental oxygen  at night for about a year now.  She does have snoring and excessive daytime fatigue symptoms.  Feels like she is tired all the time.   No issues with drowsy driving, sleep parasomnia/paralysis, morning headaches. Her husband was recently diagnosed with prostate cancer. He is preparing to have radioactive seed implant and 5 cycles of radiation.  Her breathing is stable. Has an occasional cough that is minimally productive. No worsening. Doesn't have to use her rescue inhaler. No cough or chest congestion.   12/01/2023: Today - follow up Patient presents today for follow-up.  After her last visit she did briefly use CPAP machine but felt like she was not getting enough oxygen  with it and was getting some headaches so she stopped using it went back to using her oxygen  at night.  She only used it for 2 or 3 nights.  Never had CPAP titration study.  Has a lot going on with her husband's health and diagnosis of prostate cancer.  He is preparing to undergo 5 rounds of radiation.  She would like to wait until October to complete a CPAP titration and is willing to retry CPAP at this point.  No issues with drowsy driving.  No morning headaches.  Breathing is stable. Had surveillance CT yesterday.  Results are pending.  Has an appointment with Dr. Sherrod next week to discuss results and follow-up.  No Known Allergies  Immunization History  Administered Date(s) Administered   Influenza Split 01/29/2008   Influenza, High Dose Seasonal PF 03/23/2016, 02/11/2017, 03/09/2018, 02/28/2019, 02/08/2020, 02/10/2021   Influenza,inj,quad, With Preservative 03/05/2015   Influenza-Unspecified 02/08/2020   PFIZER(Purple Top)SARS-COV-2 Vaccination  07/12/2019, 08/07/2019, 03/10/2020   Pneumococcal Conjugate-13 03/23/2016   Pneumococcal Polysaccharide-23 02/24/2018   Pneumococcal-Unspecified 02/24/2018   Tdap 08/12/2021   Zoster, Live 11/07/2018, 02/28/2019    Past Medical History:  Diagnosis Date   Anxiety    Depression    Diabetes (HCC)    Dyspnea    GERD (gastroesophageal reflux disease)    HTN (hypertension)    Hyperglycemia    Pre-diabetes      Tobacco History: Social History   Tobacco Use  Smoking Status Former   Current packs/day: 0.00   Types: Cigarettes   Quit date: 2019   Years since quitting: 6.5  Smokeless Tobacco Never   Counseling given: Not Answered   Outpatient Medications Prior to Visit  Medication Sig Dispense Refill   albuterol  (VENTOLIN  HFA) 108 (90 Base) MCG/ACT inhaler Inhale 1 puff into the lungs every 4 (four) hours as needed.     ALPRAZolam  (XANAX ) 0.25 MG tablet Take 0.25 mg by mouth daily as needed for anxiety or sleep.     Ascorbic Acid (VITAMIN C) 1000 MG tablet Take 1,000 mg by mouth daily.     aspirin  81 MG chewable tablet Chew 81 mg by mouth daily.     atorvastatin  (LIPITOR) 10 MG tablet Take 10 mg by mouth daily.     Cholecalciferol (VITAMIN D3) 125 MCG (5000 UT) TABS Take 5,000 Units by mouth daily.     Fish Oil-Krill Oil (KRILL & FISH OIL BLEND PO) Take 1 capsule by mouth daily. Mega Red     fluocinolone (SYNALAR) 0.01 % external solution 5 drops 2 (two) times daily. Ear drops     hydrocortisone cream 1 % Apply 1 Application topically daily as needed (On ear).     Magnesium  400 MG TABS Take 400 mg by mouth daily.     metFORMIN (GLUCOPHAGE) 500 MG tablet Take 500 mg by mouth daily before supper.     Multiple Vitamins-Minerals (MULTIVITAMIN ADULTS 50+ PO) Take 1 tablet by mouth daily. Centrum silver     OZEMPIC, 0.25 OR 0.5 MG/DOSE, 2 MG/3ML SOPN inject 0.25mg  once weekly for 4 weeks then increase to 0.5 MG Subcutaneous once a week for 90 days     polyethylene glycol (MIRALAX  / GLYCOLAX ) 17 g packet Take 17 g by mouth daily.     triamterene -hydrochlorothiazide  (DYAZIDE ) 37.5-25 MG capsule Take 1 capsule by mouth daily.     verapamil  (CALAN -SR) 120 MG CR tablet Take 120 mg by mouth 2 (two) times daily.     No facility-administered medications prior to visit.     Review of Systems:   Constitutional: No weight loss or gain, night sweats, fevers, chills,or lassitude. +fatigue  HEENT: No  headaches, difficulty swallowing, tooth/dental problems, or sore throat. No sneezing, itching, ear ache, nasal congestion, or post nasal drip CV:  No chest pain, orthopnea, PND, swelling in lower extremities Resp: +snoring; stable shortness of breath with exertion. No excess mucus or change in color of mucus. No productive or non-productive. No hemoptysis. No wheezing.  No chest wall deformity GI:  No heartburn, indigestion GU: No nocturia Skin: No rash, lesions, ulcerations MSK:  No joint pain or swelling.   Neuro: No memory impairment Psych: No depression or anxiety. Mood stable. +sleep disturbance     Physical Exam:  BP 130/80 (BP Location: Left Arm, Patient Position: Sitting, Cuff Size: Large)   Ht 5' 6 (1.676 m)   Wt 200 lb 6.4 oz (90.9 kg)   SpO2 92%   BMI 32.35  kg/m   GEN: Pleasant, interactive, well-appearing; morbidly obese; in no acute distress HEENT:  Normocephalic and atraumatic. PERRLA. Sclera white. Nasal turbinates pink, moist and patent bilaterally. No rhinorrhea present. Oropharynx pink and moist, without exudate or edema. No lesions, ulcerations, or postnasal drip. Mallampati III/IV NECK:  Supple w/ fair ROM. No lymphadenopathy.   CV: RRR, no m/r/g, no peripheral edema. Pulses intact, +2 bilaterally. No cyanosis, pallor or clubbing. PULMONARY:  Unlabored, regular breathing. Clear bilaterally A&P w/o wheezes/rales/rhonchi. No accessory muscle use.  GI: BS present and normoactive. Soft, non-tender to palpation. MSK: No erythema, warmth or tenderness. Cap refil <2 sec all extrem. No deformities or joint swelling noted.  Neuro: A/Ox3. No focal deficits noted.   Skin: Warm, no lesions or rashe Psych: Normal affect and behavior. Judgement and thought content appropriate.     Lab Results:  CBC    Component Value Date/Time   WBC 5.5 11/29/2023 1154   WBC 10.1 10/24/2021 0535   RBC 4.62 11/29/2023 1154   HGB 14.3 11/29/2023 1154   HCT 42.2 11/29/2023 1154   PLT  188 11/29/2023 1154   MCV 91.3 11/29/2023 1154   MCH 31.0 11/29/2023 1154   MCHC 33.9 11/29/2023 1154   RDW 13.2 11/29/2023 1154   LYMPHSABS 1.7 11/29/2023 1154   MONOABS 0.4 11/29/2023 1154   EOSABS 0.2 11/29/2023 1154   BASOSABS 0.0 11/29/2023 1154    BMET    Component Value Date/Time   NA 142 11/29/2023 1154   K 3.6 11/29/2023 1154   CL 103 11/29/2023 1154   CO2 34 (H) 11/29/2023 1154   GLUCOSE 96 11/29/2023 1154   BUN 18 11/29/2023 1154   CREATININE 1.10 (H) 11/29/2023 1154   CALCIUM  9.8 11/29/2023 1154   GFRNONAA 53 (L) 11/29/2023 1154    BNP No results found for: BNP   Imaging:  No results found.  Administration History     None          Latest Ref Rng & Units 09/17/2021    9:29 AM  PFT Results  FVC-Pre L 2.05   FVC-Predicted Pre % 77   FVC-Post L 2.05   FVC-Predicted Post % 77   Pre FEV1/FVC % % 77   Post FEV1/FCV % % 74   FEV1-Pre L 1.58   FEV1-Predicted Pre % 79   FEV1-Post L 1.52   DLCO uncorrected ml/min/mmHg 18.12   DLCO UNC% % 103   DLCO corrected ml/min/mmHg 17.15   DLCO COR %Predicted % 97   DLVA Predicted % 110     No results found for: NITRICOXIDE      Assessment & Plan:   Obstructive sleep apnea Moderate OSA with AHI 24.2/h, SpO2 low 70%. Currently on nocturnal oxygen .  Trialed CPAP but had difficulties tolerating.  Felt like she was not getting enough air.  Returned that her device. Recommend in lab CPAP titration study given history of nocturnal hypoxia.  She was agreeable to doing this within a few months.  Again reviewed risks of untreated OSA.  Recommendation is still made made for CPAP given severity of sleep apnea.  Could also consider hypoglossal nerve stimulator with inspire implant.  Healthy weight loss encouraged.  Safe driving practices reviewed.    Patient Instructions  Continue to use your oxygen  2 lpm at night until you start CPAP We will have you complete an in lab CPAP titration study - someone will contact  you to schedule this    Continue Albuterol  inhaler 2 puffs every  6 hours as needed for shortness of breath or wheezing. Notify if symptoms persist despite rescue inhaler/neb use.    Attend follow up with dr. Sherrod as scheduled    Follow up in late October/early November with Katie Adelie Croswell,NP to review CPAP titration. Follow up with Dr. Shelah in 6 months. If symptoms do not improve or worsen, please contact office for sooner follow up or seek emergency care.   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  Awaiting final results on CT chest from yesterday 7/16.  Follow-up with Dr. Gatha next week.  Will move to yearly surveillance if stable at this point.  Nocturnal hypoxemia Continue supplemental oxygen  2 lpm in interim. See above.   COPD (chronic obstructive pulmonary disease) (HCC) Mild. Stable dyspnea. Overall low symptom burden. Continue SABA PRN. Action plan in place.     Advised if symptoms do not improve or worsen, to please contact office for sooner follow up or seek emergency care.   I spent 35 minutes of dedicated to the care of this patient on the date of this encounter to include pre-visit review of records, face-to-face time with the patient discussing conditions above, post visit ordering of testing, clinical documentation with the electronic health record, making appropriate referrals as documented, and communicating necessary findings to members of the patients care team.  Comer LULLA Rouleau, NP 12/01/2023  Pt aware and understands NP's role.

## 2023-12-01 NOTE — Assessment & Plan Note (Signed)
 Mild. Stable dyspnea. Overall low symptom burden. Continue SABA PRN. Action plan in place.

## 2023-12-01 NOTE — Patient Instructions (Signed)
 Continue to use your oxygen  2 lpm at night until you start CPAP We will have you complete an in lab CPAP titration study - someone will contact you to schedule this    Continue Albuterol  inhaler 2 puffs every 6 hours as needed for shortness of breath or wheezing. Notify if symptoms persist despite rescue inhaler/neb use.    Attend follow up with dr. Sherrod as scheduled    Follow up in late October/early November with Kathryn Jowanna Loeffler,NP to review CPAP titration. Follow up with Dr. Shelah in 6 months. If symptoms do not improve or worsen, please contact office for sooner follow up or seek emergency care.

## 2023-12-01 NOTE — Assessment & Plan Note (Signed)
 Stable on prior imaging.  Awaiting final results on CT chest from yesterday 7/16.  Follow-up with Dr. Gatha next week.  Will move to yearly surveillance if stable at this point.

## 2023-12-01 NOTE — Assessment & Plan Note (Signed)
 Moderate OSA with AHI 24.2/h, SpO2 low 70%. Currently on nocturnal oxygen .  Trialed CPAP but had difficulties tolerating.  Felt like she was not getting enough air.  Returned that her device. Recommend in lab CPAP titration study given history of nocturnal hypoxia.  She was agreeable to doing this within a few months.  Again reviewed risks of untreated OSA.  Recommendation is still made made for CPAP given severity of sleep apnea.  Could also consider hypoglossal nerve stimulator with inspire implant.  Healthy weight loss encouraged.  Safe driving practices reviewed.    Patient Instructions  Continue to use your oxygen  2 lpm at night until you start CPAP We will have you complete an in lab CPAP titration study - someone will contact you to schedule this    Continue Albuterol  inhaler 2 puffs every 6 hours as needed for shortness of breath or wheezing. Notify if symptoms persist despite rescue inhaler/neb use.    Attend follow up with dr. Sherrod as scheduled    Follow up in late October/early November with Katie Deasiah Hagberg,NP to review CPAP titration. Follow up with Dr. Shelah in 6 months. If symptoms do not improve or worsen, please contact office for sooner follow up or seek emergency care.

## 2023-12-06 ENCOUNTER — Inpatient Hospital Stay (HOSPITAL_BASED_OUTPATIENT_CLINIC_OR_DEPARTMENT_OTHER): Payer: Medicare Other | Admitting: Internal Medicine

## 2023-12-06 VITALS — BP 135/82 | HR 96 | Temp 97.4°F | Resp 18 | Ht 61.0 in | Wt 248.0 lb

## 2023-12-06 DIAGNOSIS — C3490 Malignant neoplasm of unspecified part of unspecified bronchus or lung: Secondary | ICD-10-CM

## 2023-12-06 DIAGNOSIS — C3412 Malignant neoplasm of upper lobe, left bronchus or lung: Secondary | ICD-10-CM | POA: Diagnosis not present

## 2023-12-06 NOTE — Progress Notes (Signed)
 [facility] [phone]   [phone]  OFFICE PROGRESS NOTE  [name], PA-C [address] [place] [address]  DIAGNOSIS:  Stage IB (T2 a, N0, M0) non-small cell lung cancer, moderately differentiated invasive squamous cell carcinoma presented with left upper lobe lung nodule  PRIOR THERAPY:  status post left upper lobectomy with lymph node sampling under the care of Dr. 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 female returns to the clinic today for follow-up visit accompanied by her husband. Discussed the use of AI scribe software for clinical note transcription with the patient, who gave verbal consent to proceed.  History of Present Illness Kathryn [name] is a 73 year old female with stage 1B non-small cell lung cancer who presents for routine follow-up after left upper lobectomy. She is accompanied by her husband.  She has a history of stage 1B non-small cell lung cancer and underwent a left upper lobectomy with lymph node sampling in June 2023. She has been on observation since the surgery. Recent imaging, a chest scan done last week, was performed.  She experiences issues with using a CPAP machine for her sleep apnea. After using the CPAP machine at home, she experienced headaches and noted her oxygen  saturation dropped to 87-88%, prompting her to discontinue its use and revert to her oxygen  therapy. She is scheduled for a sleep study in September but is reluctant to attend.  She has a history of COPD, for which she was prescribed albuterol , but she does not use it. She quit smoking six years ago after a long history of smoking, which has contributed to her lung damage.  In terms of her blood work, she mentions that it often shows abnormalities, but she  does not pay much attention to it unless it is critical. Her recent blood work showed a serum creatinine of 1.1 and CO2 of 34.      MEDICAL HISTORY: Past Medical History:  Diagnosis Date   Anxiety    Depression    Diabetes (HCC)    Dyspnea    GERD (gastroesophageal reflux disease)    HTN (hypertension)    Hyperglycemia    Pre-diabetes     ALLERGIES:  has no known allergies.  MEDICATIONS:  Current Outpatient Medications  Medication Sig Dispense Refill   albuterol  (VENTOLIN  HFA) 108 (90 Base) MCG/ACT inhaler Inhale 1 puff into the lungs every 4 (four) hours as needed.     ALPRAZolam  (XANAX ) 0.25 MG tablet Take 0.25 mg by mouth daily as needed for anxiety or sleep.     Ascorbic Acid (VITAMIN C) 1000 MG tablet Take 1,000 mg by mouth daily.     aspirin  81 MG chewable tablet Chew 81 mg by mouth daily.     atorvastatin  (LIPITOR) 10 MG tablet Take 10 mg by mouth daily.     Cholecalciferol (VITAMIN D3) 125 MCG (5000 UT) TABS Take 5,000 Units by mouth daily.     Fish Oil-Krill Oil (KRILL & FISH OIL BLEND PO) Take 1 capsule by mouth daily. Mega Red     fluocinolone (SYNALAR) 0.01 % external solution 5 drops 2 (two) times daily. Ear drops     hydrocortisone cream 1 % Apply 1 Application topically daily as needed (On ear).     Magnesium  400 MG TABS  Take 400 mg by mouth daily.     metFORMIN (GLUCOPHAGE) 500 MG tablet Take 500 mg by mouth daily before supper.     Multiple Vitamins-Minerals (MULTIVITAMIN ADULTS 50+ PO) Take 1 tablet by mouth daily. Centrum silver     OZEMPIC, 0.25 OR 0.5 MG/DOSE, 2 MG/3ML SOPN inject 0.25mg  once weekly for 4 weeks then increase to 0.5 MG Subcutaneous once a week for 90 days     polyethylene glycol (MIRALAX  / GLYCOLAX ) 17 g packet Take 17 g by mouth daily.     triamterene -hydrochlorothiazide  (DYAZIDE ) 37.5-25 MG capsule Take 1 capsule by mouth daily.     verapamil  (CALAN -SR) 120 MG CR tablet Take 120 mg by mouth 2 (two) times daily.     No current  facility-administered medications for this visit.    SURGICAL HISTORY:  Past Surgical History:  Procedure Laterality Date   CHOLECYSTECTOMY     COLONOSCOPY WITH PROPOFOL  N/A 06/01/2022   Procedure: COLONOSCOPY WITH PROPOFOL ;  Surgeon: Saintclair Jasper, MD;  Location: [facility];  Service: Gastroenterology;  Laterality: N/A;   CYSTOSCOPY  10/22/2021   Procedure: CYSTOSCOPY, Urethral dilation, foley placement;  Surgeon: Nieves Cough, MD;  Location: [facility] OR;  Service: Urology;;   ELBOW SURGERY     INTERCOSTAL NERVE BLOCK Left 10/22/2021   Procedure: INTERCOSTAL NERVE BLOCK;  Surgeon: Shyrl Linnie KIDD, MD;  Location: [facility] OR;  Service: Thoracic;  Laterality: Left;   NODE DISSECTION Left 10/22/2021   Procedure: NODE DISSECTION;  Surgeon: Shyrl Linnie KIDD, MD;  Location: [facility] OR;  Service: Thoracic;  Laterality: Left;   POLYPECTOMY  06/01/2022   Procedure: POLYPECTOMY;  Surgeon: Saintclair Jasper, MD;  Location: [facility];  Service: Gastroenterology;;    REVIEW OF SYSTEMS:  A comprehensive review of systems was negative except for: Respiratory: positive for dyspnea on exertion   PHYSICAL EXAMINATION: General appearance: alert, cooperative, fatigued, and no distress Head: Normocephalic, without obvious abnormality, atraumatic Neck: no adenopathy, no JVD, supple, symmetrical, trachea midline, and thyroid not enlarged, symmetric, no tenderness/mass/nodules Lymph nodes: Cervical, supraclavicular, and axillary nodes normal. Resp: clear to auscultation bilaterally Back: symmetric, no curvature. ROM normal. No CVA tenderness. Cardio: regular rate and rhythm, S1, S2 normal, no murmur, click, rub or gallop GI: soft, non-tender; bowel sounds normal; no masses,  no organomegaly Extremities: extremities normal, atraumatic, no cyanosis or edema  ECOG PERFORMANCE STATUS: 1 - Symptomatic but completely ambulatory  Blood pressure (!) 150/87, pulse 96, temperature (!) 97.4 F (36.3 C), temperature source  Temporal, resp. rate 18, height 5' 1 (1.549 m), weight 248 lb (112.5 kg), SpO2 96%.  LABORATORY DATA: Lab Results  Component Value Date   WBC 5.5 11/29/2023   HGB 14.3 11/29/2023   HCT 42.2 11/29/2023   MCV 91.3 11/29/2023   PLT 188 11/29/2023      Chemistry      Component Value Date/Time   NA 142 11/29/2023 1154   K 3.6 11/29/2023 1154   CL 103 11/29/2023 1154   CO2 34 (H) 11/29/2023 1154   BUN 18 11/29/2023 1154   CREATININE 1.10 (H) 11/29/2023 1154      Component Value Date/Time   CALCIUM  9.8 11/29/2023 1154   ALKPHOS 35 (L) 11/29/2023 1154   AST 28 11/29/2023 1154   ALT 35 11/29/2023 1154   BILITOT 0.7 11/29/2023 1154       RADIOGRAPHIC STUDIES: CT Chest W Contrast Result Date: 12/02/2023 CLINICAL DATA:  Non-small cell lung cancer EXAM: CT CHEST WITH CONTRAST TECHNIQUE: Multidetector CT imaging of  the chest was performed during intravenous contrast administration. RADIATION DOSE REDUCTION: This exam was performed according to the departmental dose-optimization program which includes automated exposure control, adjustment of the mA and/or kV according to patient size and/or use of iterative reconstruction technique. CONTRAST:  60mL OMNIPAQUE  IOHEXOL  300 MG/ML  SOLN COMPARISON:  Chest CT June 02, 2023 FINDINGS: Cardiovascular: The heart size is normal. No pericardial effusion. Coronary artery calcification. Atherosclerotic calcification is of the thoracic aorta. Mediastinum/Nodes: No mediastinal lymphadenopathy. There is no hilar lymphadenopathy. The esophagus has normal . There is no axillary lymphadenopathy. Lungs/Pleura: Status post left upper lobectomy without suspicious finding to suggest recurrent malignancy at the surgical stump . Left lower lobe micronodule is stable (6/49). No new pulmonary nodule.  No pleural effusion. Upper Abdomen: Cholecystectomy. Hepatic steatosis. Small splenule. Stable 7 mm hypodensity inferior spleen, likely benign. Musculoskeletal: No  suspicious  osseous abnormality. IMPRESSION: 1. Left upper lobectomy. No suspicious finding to suggest recurrent malignancy at the surgical stump. 2. Stable left lower lobe micronodule. Follow-up according to oncologic protocol. 3.  No nodal or distant metastasis within the field of view. 4.  Stable upper abdominal findings . Electronically Signed   By: Megan  Zare M.D.   On: 12/02/2023 20:02    ASSESSMENT AND PLAN: This is a very pleasant 73 years old white female with Stage IB (T2 a, N0, M0) non-small cell lung cancer, moderately differentiated invasive squamous cell carcinoma presented with left upper lobe lung nodule status post left upper lobectomy with lymph node sampling under the care of Dr. 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 The patient had repeat CT scan of the chest performed recently.  I personally and independently reviewed the scan and discussed the results with the patient today.  Her scan showed no concerning findings for disease recurrence or metastasis. Assessment and Plan Assessment & Plan Stage 1B non-small cell lung cancer, squamous cell carcinoma Status post left upper lobectomy with lymph node sampling in June 2023. Currently under observation with recent chest scan showing no concerning findings. Serum creatinine at 1.1 and CO2 at 34, normal for her condition. - Continue annual surveillance with chest scans - Schedule follow-up in one year  Likely COPD Likely COPD based on history of smoking and current symptoms. Not using prescribed albuterol  inhaler. - Discuss COPD management with pulmonologist - Encourage use of albuterol  inhaler as needed for dyspnea  Sleep apnea Experiencing difficulty with CPAP therapy, including headaches and low oxygen  saturation readings. Scheduled for a sleep study  in September with Dr. Jude. - Discuss CPAP issues with Dr. Jude - Proceed with scheduled sleep study in September The patient was advised to call immediately if she has any concerning symptoms in the interval.  The patient voices understanding of current disease status and treatment options and is in agreement with the current care plan.  All questions were answered. The patient knows to call the clinic with any problems, questions or concerns. We can certainly see the patient much sooner if necessary.  The total time spent in the appointment was 20 minutes.  Disclaimer: This note was dictated with voice recognition software. Similar sounding words can inadvertently be transcribed and may not be corrected upon review.

## 2023-12-21 NOTE — Telephone Encounter (Signed)
 See other phone note

## 2024-01-20 ENCOUNTER — Ambulatory Visit (HOSPITAL_BASED_OUTPATIENT_CLINIC_OR_DEPARTMENT_OTHER): Attending: Nurse Practitioner | Admitting: Internal Medicine

## 2024-01-20 DIAGNOSIS — G4733 Obstructive sleep apnea (adult) (pediatric): Secondary | ICD-10-CM

## 2024-01-20 DIAGNOSIS — G4730 Sleep apnea, unspecified: Secondary | ICD-10-CM | POA: Diagnosis not present

## 2024-01-29 NOTE — Procedures (Signed)
 [facility] [address] [place], [place] [address] Tel: [phone]   Fax: [phone]  Titration Interpretation  Patient Name:  Kathryn [name], Kathryn [name] Study Date:  01/20/2024 Referring Physician:  KATHERINE COBB [phone]) %%startinterp%% Indications for Polysomnography The patient is a 73 year-old Female who is 5' 1 and weighs 248.0 lbs. Her BMI equals 47.4.  A full night titration treatment  Baseline HST 08/04/23 AHI 24.2/hr, desat to 70%, body weight 264 lbs  Medication  No Data.   Polysomnogram Data A full night polysomnogram recorded the standard physiologic parameters including EEG, EOG, EMG, EKG, nasal and oral airflow.  Respiratory parameters of chest and abdominal movements were recorded with Respiratory Inductance Plethysmography belts.  Oxygen  saturation was recorded by pulse oximetry.   Sleep Architecture The total recording time of the polysomnogram was 439.0 minutes.  The total sleep time was 161.0 minutes.  The patient spent 4.7% of total sleep time in Stage N1, 80.4% in Stage N2, 0.0% in Stages N3, and 14.9% in REM.  Sleep latency was 88.1 minutes.  REM latency was 326.0 minutes.  Sleep Efficiency was 36.7%.  Wake after Sleep Onset time was 190.0 minutes.  Titration Summary The patient was titrated at pressures ranging from 5* cm/H20 with supplemental oxygen  at - up to 12* cm/H20 with supplemental oxygen  at O2: 2.  The last pressure used in the study was 12* cm/H20 with supplemental oxygen  at O2: 2.  Respiratory Events The polysomnogram revealed a presence of 13 obstructive, - central, and - mixed apneas resulting in an Apnea index of 4.8 events per hour.  There were 58 hypopneas (>=3% desaturation and/or arousal) resulting in an Apnea\Hypopnea Index (AHI >=3% desaturation and/or arousal) of 26.5 events per hour.  There were 41 hypopneas (>=4% desaturation) resulting in an Apnea\Hypopnea Index (AHI >=4% desaturation) of 20.1  events per hour.  There were - Respiratory Effort Related Arousals resulting in a RERA index of - events per hour. The Respiratory Disturbance Index is 26.5 events per hour.  The snore index was 52.9 events per hour.  Mean oxygen  saturation was 92.3%.  The lowest oxygen  saturation during sleep was 80.0%.  Time spent <=88% oxygen  saturation was 84.4 minutes (20.0%).  Limb Activity There were 5 limb movements recorded.  Of this total, - were classified as PLMs.  Of the PLMs, - were associated with arousals.  The Limb Movement index was 1.9 per hour while the PLM index was - per hour.  Cardiac Summary The average pulse rate was 62.1 bpm.  The minimum pulse rate was 35.0 bpm while the maximum pulse rate was 83.0 bpm.  Cardiac rhythm was normal/abnormal.  Comments: CPAP titration to 12 cwp. O2 2L added per protocol due top persistent saturation during titration. Final AHI(4%) 0/hr, nadir O2 saturation (on 2L) 94%, mean 96.1%.  Diagnosis: Obstructive sleep apnea  Recommendations: Suggest autopap 5-15 or fixed 12 cwp. Consider verifying need for supplemental O2 at therapeutic pressure. Patient wore a medium F&P Simplus full face mask with heated humidity.   This study was personally reviewed and electronically signed by: REGGY SALT MD Accredited Board Certified in Sleep Medicine Date/Time: 01/29/24  11:46    %%endinterp%%  Titration Report  Patient Name: Kathryn [name] Study Date: 01/20/2024  Date of Birth: 10-01-1950 Study Type: CPAP Titration  Age: 73 year MRN #: [id number]  Sex: Female Interpreting Physician: SALT REGGY, [id number]  Height: 5' 1 Referring Physician: KATHERINE COBB [phone])  Weight:  248.0 lbs Recording Tech: Prentice Coombe RPSGT RST  BMI: 47.4 Scoring Tech: Prentice Coombe RPSGT RST  ESS: 6 Neck Size: 15  Mask Type Simplus Full Face Mask Final Pressure: 12cmH20  Mask Size: Medium Supplemental O2: 2 LITERS   Study Overview  Lights Off: 09:32:59 PM  Count Index  Lights On:  04:51:58 AM Awakenings: 10 3.7  Time in Bed: 439.0 min. Arousals: 1 0.4  Total Sleep Time: 161.0 min. AHI (>=3% Desat and/or Ar.): 71 26.5   Sleep Efficiency: 36.7% AHI (>=4% Desat): 54 20.1   Sleep Latency: 88.1 min. Limb Movements: 5 1.9  Wake After Sleep Onset: 190.0 min. Snore: 142 52.9  REM Latency from Sleep Onset: 326.0 min. Desaturations: 72 27.2     Minimum SpO2 TST: 80.0%    Sleep Architecture  % of Time in Bed Stages Time (mins) % Sleep Time  Wake 278.5   Stage N1 7.5 4.7%  Stage N2 129.5 80.4%  Stage N3 0.0 0.0%  REM 24.0 14.9%   Arousal Summary   NREM REM Sleep Index  Respiratory Arousals - - - -  PLM Arousals - - - -  Isolated Limb Movement Arousals - - - -  Snore Arousals - - - -  Spontaneous Arousals 1 - 1 0.4  Total 1 - 1 0.4   Limb Movement Summary   Count Index  Isolated Limb Movements 5 1.9  Periodic Limb Movements (PLMs) - -  Total Limb Movements 5 1.9    Respiratory Summary   By Sleep Stage By Body Position Total   NREM REM Supine Non-Supine   Time (min) 137.0 24.0 - 161.0 161.0         Obstructive Apnea 13 - - 13 13  Mixed Apnea - - - - -  Central Apnea - - - - -  Total Apneas 13 - - 13 13  Total Apnea Index 5.7 - - 4.8 4.8         Hypopneas (>=3% Desat and/or Ar.) 58 - - 58 58  AHI (>=3% Desat and/or Ar.) 31.1 - - 26.5 26.5         Hypopneas (>=4% Desat) 41 - - 41 41  AHI (>=4% Desat) 23.6 - - 20.1 20.1          RERAs - - - - -  RERA Index - - - - -         RDI 31.1 - - 26.5 26.5     Respiratory Event Durations   Apnea Hypopnea   NREM REM NREM REM  Average (seconds) 17.6 - 32.2 -  Maximum (seconds) 19.7 - 50.7 -    Oxygen  Saturation Summary   Wake NREM REM TST TIB  Average SpO2 92.1% 91.9% 95.6% 92.5% 92.3%  Minimum SpO2 82.0% 80.0% 94.0% 80.0% 80.0%  Maximum SpO2 100.0% 99.0% 98.0% 99.0% 100.0%   Oxygen  Saturation Distribution  Range (%) Time in range (min) Time in range (%)  90.0 - 100.0 293.1 69.5%  80.0 - 90.0  128.3 30.4%  70.0 - 80.0 0.1 0.0%  60.0 - 70.0 - -  50.0 - 60.0 - -  0.0 - 50.0 - -  Time Spent <=88% SpO2  Range (%) Time in range (min) Time in range (%)  0.0 - 88.0 84.4 20.0%      Count Index  Desaturations 72 27.2    Cardiac Summary   Wake NREM REM Sleep Total  Average Pulse Rate (BPM) 65.3 57.4 52.9 56.7 62.1  Minimum  Pulse Rate (BPM) 35.0 47.0 47.0 47.0 35.0  Maximum Pulse Rate (BPM) 83.0 75.0 57.0 75.0 83.0   Pulse Rate Distribution:  Range (bpm) Time in range (min) Time in range (%)  0.0 - 40.0 0.0 0.0%  40.0 - 60.0 161.9 38.1%  60.0 - 80.0 262.0 61.7%  80.0 - 100.0 0.7 0.2%  100.0 - 120.0 - -  120.0 - 140.0 - -  140.0 - 200.0 - -   Titration Summary  PAP Device PAP Level O2 Level Time (min) Wake (min) NREM (min) REM (min) Sleep Eff% OA# CA# MA# Hyp# (>=3%) AHI (>=3%) Hyp# (>=4%) AHI (>=%4) RERA RDI SpO2 <=88% (min) Min SpO2 Mean SpO2 Ar. Index  - Off - 0.5 0.5 0.0 0.0 0.0%               CPAP 5 - 72.5 72.5 0.0 0.0 0.0%               CPAP 7 - 148.5 112.5 36.0 0.0 24.2% 13 - - 36 81.7 33  76.7 -  81.7  15.4 80.0 89.5 -  CPAP 9 - 21.0 2.5 18.5 0.0 88.1% - - - 6 19.5 2  6.5 -  19.5  18.3 82.0 84.3 -  CPAP 10 O2: 2 96.5 77.0 19.5 0.0 20.2% - - - 16 49.2 6  18.5 -  49.2  9.4 85.0 88.9 -  CPAP 12 O2: 2 100.5 13.5 63.0 24.0 86.6% - - - - - -  - -  -  0.0 94.0 96.1 0.7    Hypnograms                           Technologist Comments  STUDY TYPE= CPAP MASK= SIMPLUS FULL FACE MASK SIZE= MEDIUM PRESSURE= 12CMH20 OXYGEN = 2 LITERS  PATIENT PRESENT HERE FOR CPAP TITRATION. HOOK UP WAS SMOOTH. PRESSURE WAS STARTED ON [id number]. PRESSURE WAS INCREASED ACCORDING TO PROTOCOL 2 LITERS OF OXYGEN  WAS APPLIED DUE TO LOW SATURATION.  THE PATIENT SLEPT LEFT, RIGHT AND SUPINE. PATIENT TOSSED AND TURNED . BATHROOM BREAK WAS TAKING ONE TIME. NO SIGNIFICANT EPISODE. SIMPLUS FULL FACE MASK WAS USED FOR THIS STUDY. OPTIMAL PRESSURE WAS REACHED AT [id number] WITH 2  LITERS OF OXYGEN . PATIENT TOLERATED THE MASK. ABNORMAL ECG WERE SEEN . COPY ATTACHED                      Reggy Salt Diplomate, Biomedical engineer of Sleep Medicine  ELECTRONICALLY SIGNED ON:  01/29/2024, 11:36 AM [facility] PH: [phone]   FX: [phone] ACCREDITED BY THE AMERICAN ACADEMY OF SLEEP MEDICINE

## 2024-01-29 NOTE — Procedures (Signed)
  Indications for Polysomnography The patient is a 73 year-old Female who is 5' 1 and weighs 248.0 lbs. Her BMI equals 47.4.  A full night titration treatment study was performed.  MedicationNo Data. Polysomnogram Data A full night polysomnogram recorded the standard physiologic parameters including EEG, EOG, EMG, EKG, nasal and oral airflow.  Respiratory parameters of chest and abdominal movements were recorded with Respiratory Inductance Plethysmography belts.   Oxygen  saturation was recorded by pulse oximetry.  Sleep Architecture The total recording time of the polysomnogram was 439.0 minutes.  The total sleep time was 161.0 minutes.  The patient spent 4.7% of total sleep time in Stage N1, 80.4% in Stage N2, 0.0% in Stages N3, and 14.9% in REM.  Sleep latency was 88.1 minutes.   REM latency was 326.0 minutes.  Sleep Efficiency was 36.7%.  Wake after Sleep Onset time was 190.0 minutes.  Titration Summary The patient was titrated at pressures ranging from 5* cm/H20 with supplemental oxygen  at - up to 12* cm/H20 with supplemental oxygen  at O2: 2.  The last pressure used in the study was 12* cm/H20 with supplemental oxygen  at O2: 2.  Respiratory Events The polysomnogram revealed a presence of 13 obstructive, - central, and - mixed apneas resulting in an Apnea index of 4.8 events per hour.  There were 58 hypopneas (GreaterEqual to3% desaturation and/or arousal) resulting in an Apnea\Hypopnea Index (AHI  GreaterEqual to3% desaturation and/or arousal) of 26.5 events per hour.  There were 41 hypopneas (GreaterEqual to4% desaturation) resulting in an Apnea\Hypopnea Index (AHI GreaterEqual to4% desaturation) of 20.1 events per hour.  There were - Respiratory  Effort Related Arousals resulting in a RERA index of - events per hour. The Respiratory Disturbance Index is 26.5 events per hour.  The snore index was 52.9 events per hour.  Mean oxygen  saturation was 92.3%.  The lowest oxygen  saturation during  sleep was 80.0%.  Time spent LessEqual to88% oxygen  saturation was  minutes ().  Limb Activity There were 5 limb movements recorded.  Of this total, - were classified as PLMs.  Of the PLMs, - were associated with arousals.  The Limb Movement index was 1.9 per hour while the PLM index was - per hour.  Cardiac Summary The average pulse rate was 62.1 bpm.  The minimum pulse rate was 35.0 bpm while the maximum pulse rate was 83.0 bpm.  Cardiac rhythm was normal/abnormal.  Comments:  Diagnosis:  Recommendations:   This study was personally reviewed and electronically signed by: REGGY SALT MD Accredited Board Certified in Sleep Medicine Date/Time:

## 2024-01-31 ENCOUNTER — Telehealth: Payer: Self-pay | Admitting: Nurse Practitioner

## 2024-01-31 DIAGNOSIS — G4733 Obstructive sleep apnea (adult) (pediatric): Secondary | ICD-10-CM

## 2024-01-31 NOTE — Telephone Encounter (Signed)
 Hx of moderate OSA 08/04/2023 CPAP titration 01/20/2024 Needs to resume CPAP therapy with O2 bled through. Please place order for new start CPAP 12 cmH2O with 2 lpm supplemental O2 bled through. F/u 10-12 weeks. Thanks!

## 2024-02-08 NOTE — Telephone Encounter (Signed)
 Called and spoke with patient relayed results,placed order.NFN

## 2024-02-17 ENCOUNTER — Encounter: Payer: Self-pay | Admitting: Cardiology

## 2024-03-15 ENCOUNTER — Ambulatory Visit: Admitting: Nurse Practitioner

## 2024-03-16 ENCOUNTER — Telehealth: Payer: Self-pay | Admitting: Nurse Practitioner

## 2024-03-16 NOTE — Telephone Encounter (Signed)
 Pt in office yesterday but OV was rescheduled due to length of time on CPAP. Stated she was having some mask troubles. Download looked great with low residual AHI. Pt stated the DME told her she couldn't get a new mask unless a new order was placed. The order we have in to the DME is for mask of choice so there is no reason they can't try alternative masks with her. Please reach out to DME to clarify. Thanks.

## 2024-03-19 NOTE — Telephone Encounter (Signed)
 Called and spoke with [name] with [employer], advised that the order from September states mask of choice, however she is being told that a new order is needed for the new mask.  [name] states that on 10/6, Patient has not been using the CPAP machine, she is only using the oxygen  (this is per the patient).  She has packed away the CPAP machine.  10/7, she is wanting someone to come out and set it up for her with the oxygen .  On 10/8, she was complaining about the pressure being too high.  Service call and pressure change was done on 10/8.  Order was not sent over to resupply.  Nothing was been sent since July.  [name] will sent to [facility] over resupply.  He believes they may have thought this was just an order for pressure change.  He will let me know if something additionally needs to occur for her to get her supplies.  Kathryn [name] is sending the order over the the manager over resupply as stat and will let us  know if anything additionally is needed.

## 2024-03-19 NOTE — Telephone Encounter (Signed)
 She was told to resume CPAP after repeat sleep study in October, which she did. She been using her CPAP consistently for almost a month now but just needs a different mask. Thanks.

## 2024-03-19 NOTE — Telephone Encounter (Signed)
 Kathryn [name] is following up on previous order sent in September.  Nothing further needed.

## 2024-03-28 ENCOUNTER — Telehealth: Payer: Self-pay | Admitting: *Deleted

## 2024-03-28 NOTE — Telephone Encounter (Signed)
 Copied from CRM [id number]. Topic: Clinical - Order For Equipment >> Mar 27, 2024  5:26 PM Kathryn [name] wrote: Reason for CRM: pt called with [employer] Rep on the line. They are needing a new order for her CPAP machine since she was not in compliance with coming for a follow up appt. [facility] is requesting for her to send the machine back so they are trying to avoid this from happening.  I have sent community msg to [employer] to see what we need to do. Will then call the pt with update.

## 2024-04-03 NOTE — Telephone Encounter (Signed)
 Copied from CRM [id number]. Topic: Clinical - Order For Equipment >> Mar 27, 2024  5:26 PM Rozanna MATSU wrote: Reason for CRM: pt called with [employer] Rep on the line. They are needing a new order for her CPAP machine since she was not in compliance with coming for a follow up appt. [facility] is requesting for her to send the machine back so they are trying to avoid this from happening. >> Mar 30, 2024  1:30 PM Leila BROCKS wrote: Leita from [employer] [phone] and patient is on the phone call. Patient states [facility] needs patient to send her cpap machine within 90 days compliance and the office needs to send a new order cpap machine, sleep study results from NP, Cobb to [facility]. Patient is asking if the office received this information from [employer] [facility]? Patient has an upcoming appointment with NP, Cobb 04/25/24 at 1:30 pm. Leita asked for patient's new order for cpap machine have an oxygen  adapter. Patient wants to speak with nurse to clarify all of this. Please advise and call back [phone].   Duplicate encounter waiting on dme response

## 2024-04-05 ENCOUNTER — Telehealth: Payer: Self-pay | Admitting: *Deleted

## 2024-04-05 NOTE — Telephone Encounter (Signed)
 I did not see a community message follow up on this patient.  I printed a download of compliance, it shows 44/44 days 100% compliance.  I called and spoke with Mitch ([employer]), he looked her up in [facility].  Insurance is looking at the first 90 days and there is no compliance.  She will have to turn her machine in because insurance will not cover the cost d/t non compliance in the first 90 days.  Have a new titration study, an office visit and a new order for CPAP.  He said he would check and make sure and send me an e-mail.  This is the response I received via e-mail:   Walterine Moats, I just got confirmation from my PAP qualifier that it would have to be a new in-lab PSG that [facility] would require.  I spoke with Izetta and she said she had the HST in March, she already had a machine.  She did not have the titration study until 01/20/24 and a new order was sent in on 02/08/24 for a new CPAP machine.  She does not have 90 days of compliance because she had not had it for 90 days.  I sent an e-mail with all the information to [facility] with [employer] to clarify the confusion on their end.  Will await a response.  I called and spoke with the patient and explained that Izetta and I are talking with [employer] and explaining that you have already had the sleep study and titration study d/t non compliance, you have gotten all the supplies you needed and a new machine and have been compliant since you received the new machine.  I told her to hang tight for now and once we get everything sorted out we will call her and let her know what she needs to do next and whether she needs to keep the 12/10 appointment with Katie.  She verbalized understanding.

## 2024-04-05 NOTE — Telephone Encounter (Signed)
 Okay, understood. I missed that from the notes that are in our system. I am going to relay this information over to our PAP qualifiers to review. With what you are saying, there definitely should be no need for a new study. I will provide all of the details and get back to you both once I have an answer.  Kathryn [name] Patient Liaison  P: [phone] E: [email or web address]

## 2024-04-05 NOTE — Telephone Encounter (Incomplete)
 Copied from CRM [id number]. Topic: Clinical - Order For Equipment >> Mar 27, 2024  5:26 PM Kathryn [name] wrote: Reason for CRM: pt called with [employer] Rep on the line. They are needing a new order for her CPAP machine since she was not in compliance with coming for a follow up appt. [facility] is requesting for her to send the machine back so they are trying to avoid this from happening. >> Mar 30, 2024  1:30 PM Kathryn [name] wrote: Leita from [employer] [phone] and patient is on the phone call. Patient states [facility] needs patient to send her cpap machine within 90 days compliance and the office needs to send a new order cpap machine, sleep study results from NP, Cobb to [facility]. Patient is asking if the office received this information from [facility]? Patient has an upcoming appointment with NP, Cobb 04/25/24 at 1:30 pm. Leita asked for patient's new order for cpap machine have an oxygen  adapter. Patient wants to speak with nurse to clarify all of this. Please advise and call back [phone].

## 2024-04-05 NOTE — Telephone Encounter (Signed)
 Cobb, Katie  Fulton-Brame, Ole Lafon;[email or web address]> There's no reason she needs a new in lab PSG. We already did all of this because she was NOT compliant with therapy. She had a new diagnostic sleep study in March 2025, which is within the last year, and a CPAP titration study in September 2025. Orders were placed to resume CPAP end of September, which she did once she received all appropriate equipment and she has had excellent compliance since then.   Izetta Rouleau, DNP, FNP-BC [facility]

## 2024-04-19 ENCOUNTER — Telehealth: Payer: Self-pay | Admitting: *Deleted

## 2024-04-19 NOTE — Telephone Encounter (Signed)
 I called and spoke with patient, she states that she has an appointment at 4 at [facility] to stitch out her CPAP machine and pick up another one.  I told her that I would call Mitch with [employer] because when we left things on 11/20, everything was settled, she did not need a new sleep study and she did not have to turn in her CPAP machine.  I told her I would call her back after I spoke with Mitch.  I spoke with Mitch with [employer] and he stated that he was told by his regional manager that the only way to get insurance to pay for the CPAP machine after she had been noncompliant is to have her come in, sign a ticket to release the CPAP she has to them and then sign a ticket to pickup another machine.  He could not explain to me why this had to be done.  He said he was going to send me a message to let me know the update.  I called and spoke with the patient and let her know that she did have to go to [facility] today at 4 pm and explained to her what was told to me.  She said she would go and do what had to be done, but was not happy about it.  I advised her to keep her f/u with us  on 12/10 and that would be her f/u that has to occur between day 31-90.  She verbalized understanding.  Nothing further needed.

## 2024-04-19 NOTE — Telephone Encounter (Signed)
 Copied from CRM #[id number]. Topic: General - Other >> Apr 16, 2024  5:02 PM Rilla B wrote: Reason for CRM: Patient calling asking to speak to [facility]. She states she's been waiting on a call from [facility] regarding [employer] [facility]. Patient states [employer] called and they are waiting a new script.  Please call patient @ [phone].  PCC's please see why order for CPAP not fulfilled yet. Arvella was supposed to [employer] back and looks like that has not happened yet.

## 2024-04-25 ENCOUNTER — Ambulatory Visit: Admitting: Nurse Practitioner

## 2024-05-22 ENCOUNTER — Encounter: Payer: Self-pay | Admitting: Nurse Practitioner

## 2024-05-22 ENCOUNTER — Telehealth: Admitting: Nurse Practitioner

## 2024-05-22 DIAGNOSIS — C3412 Malignant neoplasm of upper lobe, left bronchus or lung: Secondary | ICD-10-CM

## 2024-05-22 DIAGNOSIS — Z87891 Personal history of nicotine dependence: Secondary | ICD-10-CM

## 2024-05-22 DIAGNOSIS — J449 Chronic obstructive pulmonary disease, unspecified: Secondary | ICD-10-CM

## 2024-05-22 DIAGNOSIS — R0902 Hypoxemia: Secondary | ICD-10-CM

## 2024-05-22 DIAGNOSIS — G4734 Idiopathic sleep related nonobstructive alveolar hypoventilation: Secondary | ICD-10-CM

## 2024-05-22 DIAGNOSIS — G4733 Obstructive sleep apnea (adult) (pediatric): Secondary | ICD-10-CM | POA: Diagnosis not present

## 2024-05-22 NOTE — Assessment & Plan Note (Addendum)
 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 Excellent compliance and control. Receives benefit from use. She will call if she has difficulties obtaining preferred mask through DME. Aware of risks of untreated OSA. Understands proper care/use of device. Encouraged to continue utilizing nightly. Safe driving practices reviewed. Continued healthy weight loss measures encouraged. Can consider repeat HST once she has lost 20% of her initial weight (264 lb). Will reassess at f/u. Advised to monitor mask fit and for any issues with pressure settings with ongoing weight loss.   Patient Instructions  Continue to use CPAP every night, minimum of 4-6 hours a night.  Change equipment as directed. Wash your tubing with warm soap and water daily, hang to dry. Wash humidifier portion weekly. Use bottled, distilled water and change daily Be aware of reduced alertness and do not drive or operate heavy machinery if experiencing this or drowsiness.  Exercise encouraged, as tolerated. Healthy weight management discussed.  Avoid or decrease alcohol consumption and medications that make you more sleepy, if possible. Notify if persistent daytime sleepiness occurs even with consistent use of PAP therapy.  Change CPAP supplies... Every month Mask cushions and/or nasal pillows CPAP machine filters Every 3 months Mask frame (not including the headgear) CPAP tubing Every 6 months Mask headgear Chin strap (if applicable) Humidifier water tub   Continue to use your oxygen  2 lpm bled through CPAP  Call us  about the mask if you're still having issues getting it from [facility]    Continue Albuterol  inhaler 2 puffs every 6 hours as needed for shortness of breath or wheezing. Notify if symptoms persist despite rescue inhaler/neb use.    Attend follow up with dr. Sherrod as scheduled    Follow up with Dr. Shelah in 6 months. If symptoms do not improve or worsen, please contact office for sooner follow up or seek  emergency care.

## 2024-05-22 NOTE — Progress Notes (Signed)
 "  @Patient  ID: Kathryn [name], female    DOB: [birth year], 74 y.o.   MRN: [id number]  Chief Complaint  Patient presents with   Sleep Apnea    CPAP Compliance. Set up on 04/19/2024.    Referring provider: Lanis Thresa JAYSON DEVONNA  Virtual Visit via Video Note  I connected with Kathryn [name] on 05/22/2024 at 11:00 AM EST by a video enabled telemedicine application and verified that I am speaking with the correct person using two identifiers.  Location: Patient: Home Provider: Office   I discussed the limitations of evaluation and management by telemedicine and the availability of in person appointments. The patient expressed understanding and agreed to proceed.  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  She is a patient of Dr. Lanny and last seen in office 12/01/2023 by [facility] NP.  Past medical history significant for allergic rhinitis, HLD, anxiety, obesity, hypertension, diabetes, GERD.  TEST/EVENTS:  09/17/2021 PFT: FVC 77, FEV1 79, ratio 74, DLCO 97 06/02/2023 CT chest: No LAD.  Mild atherosclerosis.  Postsurgical changes in the left hilum. 08/04/2023 HST: AHI 24.2, SpO2 low 70% 11/29/2023 CT chest: LUL lobectomy. No evidence of recurrence. Stable LLL micronodule 01/20/2024 CPAP titration >> 12 cmH2O with 2 lpm bled through  07/20/2023: OV with Dr. Shelah.  Has albuterol  available to use as needed but has not  required this.  Not on any scheduled BD therapy.  Dealing with allergy symptoms, describes some intermittent cough and upper airway irritation.  Has shortness of breath with stairs.  Wearing 2 L supplemental O2 at bedtime.  Has been decided that she snores.  Sleep study ordered for further evaluation to evaluate for OSA/OHS.  Could consider long-acting bronchodilators in the future if she has any issues with her breathing.  Suspect that her cough  is multifactorial.  Try treating allergic rhinitis and see how she responds to this.  Has been seen by ENT in the past with evaluation that showed upper airway inflammation.  She was on PPI.  No overt GERD symptoms.  Followed by Dr. Gatha with stable prior CT.  09/19/2023: OV with Der Gagliano NP Patient presents today with her husband for follow-up.  She had a home sleep study that revealed moderate sleep apnea.  She has been using supplemental oxygen  at night for about a year now.  She does have snoring and excessive daytime fatigue symptoms.  Feels like she is tired all the time.  No issues with drowsy driving, sleep parasomnia/paralysis, morning headaches. Her husband was recently diagnosed with prostate cancer. He is preparing to have radioactive seed implant and 5 cycles of radiation.  Her breathing is stable. Has an occasional cough that is minimally productive. No worsening. Doesn't have to use her rescue inhaler. No cough or chest congestion.   12/01/2023: SHERLEAN with Dewaun Kinzler NP Patient presents today for follow-up.  After her last visit she did briefly use CPAP machine but felt like she was not getting enough oxygen  with it and was getting some headaches so she stopped using it went back to using her oxygen  at night.  She only used it for 2 or 3 nights.  Never had CPAP titration study.  Has a lot going on with her husband's health and diagnosis of prostate cancer.  He is preparing to undergo 5 rounds of radiation.  She would like to wait until October to complete a CPAP  titration and is willing to retry CPAP at this point.  No issues with drowsy driving.  No morning headaches.  Breathing is stable. Had surveillance CT yesterday.  Results are pending.  Has an appointment with Dr. Sherrod next week to discuss results and follow-up.  05/22/2024: Today - follow up Discussed the use of AI scribe software for clinical note transcription with the patient, who gave verbal consent to proceed.  History of Present  Illness Kathryn [name] is a 74 year old female with sleep apnea who presents for follow-up regarding CPAP use.   She uses a CPAP machine for her sleep apnea and feels it is controlling her condition well. She dislikes the current mask provided by [employer] [facility] and prefers the one she used in the hospital. She has been using the CPAP machine consistently and notes that the moisture from the machine helps prevent dryness, which she appreciates. She is sleeping better at night and feels her energy levels are adequate during the day. No issues with breathing are reported, and she notes improved sleep quality with the CPAP machine. No drowsy driving.   She has been on Ozempic and has lost 40 pounds, reducing her weight from 264 pounds to 227 pounds. She does not have a specific goal weight but aims to be healthy. No issues with pressure settings on CPAP. No bloating or belching in AM with use.   She continues to use oxygen  with the CPAP machine.  She had a CT scan in July and is due for her next scan July 2026. Follows with oncology.     No Known Allergies  Immunization History  Administered Date(s) Administered   INFLUENZA, HIGH DOSE SEASONAL PF 03/23/2016, 02/11/2017, 03/09/2018, 02/28/2019, 02/08/2020, 02/10/2021   Influenza Split 01/29/2008   Influenza,inj,quad, With Preservative 03/05/2015   Influenza-Unspecified 02/08/2020   PFIZER(Purple Top)SARS-COV-2 Vaccination 07/12/2019, 08/07/2019, 03/10/2020   Pneumococcal Conjugate-13 03/23/2016   Pneumococcal Polysaccharide-23 02/24/2018   Pneumococcal-Unspecified 02/24/2018   Tdap 08/12/2021   Zoster, Live 11/07/2018, 02/28/2019    Past Medical History:  Diagnosis Date   Anxiety    Depression    Diabetes (HCC)    Dyspnea    GERD (gastroesophageal reflux disease)    HTN (hypertension)    Hyperglycemia    Pre-diabetes     Tobacco History: Social History   Tobacco Use  Smoking Status Former   Current packs/day: 0.00    Types: Cigarettes   Quit date: 2019   Years since quitting: 7.0  Smokeless Tobacco Never   Counseling given: Not Answered   Outpatient Medications Prior to Visit  Medication Sig Dispense Refill   albuterol  (VENTOLIN  HFA) 108 (90 Base) MCG/ACT inhaler Inhale 1 puff into the lungs every 4 (four) hours as needed.     ALPRAZolam  (XANAX ) 0.25 MG tablet Take 0.25 mg by mouth daily as needed for anxiety or sleep.     Ascorbic Acid (VITAMIN C) 1000 MG tablet Take 1,000 mg by mouth daily.     aspirin  81 MG chewable tablet Chew 81 mg by mouth daily.     atorvastatin  (LIPITOR) 10 MG tablet Take 10 mg by mouth daily.     Cholecalciferol (VITAMIN D3) 125 MCG (5000 UT) TABS Take 5,000 Units by mouth daily.     Fish Oil-Krill Oil (KRILL & FISH OIL BLEND PO) Take 1 capsule by mouth daily. Mega Red     fluocinolone (SYNALAR) 0.01 % external solution 5 drops 2 (two) times daily. Ear drops  hydrocortisone cream 1 % Apply 1 Application topically daily as needed (On ear).     Magnesium  400 MG TABS Take 400 mg by mouth daily.     metFORMIN (GLUCOPHAGE) 500 MG tablet Take 500 mg by mouth daily before supper.     Multiple Vitamins-Minerals (MULTIVITAMIN ADULTS 50+ PO) Take 1 tablet by mouth daily. Centrum silver     OZEMPIC, 0.25 OR 0.5 MG/DOSE, 2 MG/3ML SOPN inject 0.25mg  once weekly for 4 weeks then increase to 0.5 MG Subcutaneous once a week for 90 days     polyethylene glycol (MIRALAX  / GLYCOLAX ) 17 g packet Take 17 g by mouth daily.     triamterene -hydrochlorothiazide  (DYAZIDE ) 37.5-25 MG capsule Take 1 capsule by mouth daily.     verapamil  (CALAN -SR) 120 MG CR tablet Take 120 mg by mouth 2 (two) times daily.     No facility-administered medications prior to visit.     Review of Systems: as above    Physical Exam: Patient is well-developed, well-nourished in no acute distress.  Resting comfortably at home.  No labored breathing.  Speech is clear and coherent with logical content.  Patient is  alert and oriented at baseline.   Assessment & Plan:   Obstructive sleep apnea Moderate OSA/nocturnal hypoxia on CPAP/O2 2 lpm. Excellent compliance and control. Receives benefit from use. She will call if she has difficulties obtaining preferred mask through DME. Aware of risks of untreated OSA. Understands proper care/use of device. Encouraged to continue utilizing nightly. Safe driving practices reviewed. Continued healthy weight loss measures encouraged. Can consider repeat HST once she has lost 20% of her initial weight (264 lb). Will reassess at f/u. Advised to monitor mask fit and for any issues with pressure settings with ongoing weight loss.   Patient Instructions  Continue to use CPAP every night, minimum of 4-6 hours a night.  Change equipment as directed. Wash your tubing with warm soap and water daily, hang to dry. Wash humidifier portion weekly. Use bottled, distilled water and change daily Be aware of reduced alertness and do not drive or operate heavy machinery if experiencing this or drowsiness.  Exercise encouraged, as tolerated. Healthy weight management discussed.  Avoid or decrease alcohol consumption and medications that make you more sleepy, if possible. Notify if persistent daytime sleepiness occurs even with consistent use of PAP therapy.  Change CPAP supplies... Every month Mask cushions and/or nasal pillows CPAP machine filters Every 3 months Mask frame (not including the headgear) CPAP tubing Every 6 months Mask headgear Chin strap (if applicable) Humidifier water tub   Continue to use your oxygen  2 lpm bled through CPAP  Call us  about the mask if you're still having issues getting it from [employer]    Continue Albuterol  inhaler 2 puffs every 6 hours as needed for shortness of breath or wheezing. Notify if symptoms persist despite rescue inhaler/neb use.    Attend follow up with dr. Sherrod as scheduled    Follow up with Dr. Shelah in 6 months. If symptoms  do not improve or worsen, please contact office for sooner follow up or seek emergency care.   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  Repeat July 2026. Follow-up with Dr. Gatha next week.  Nocturnal hypoxemia See above  COPD (chronic obstructive pulmonary disease) (HCC) Mild. Overall low symptom burden. Continue SABA PRN. Action plan in place.     I discussed the assessment and treatment plan with the patient. The  patient was provided an opportunity to ask questions and all were answered. The patient agreed with the plan and demonstrated an understanding of the instructions.   The patient was advised to call back or seek an in-person evaluation if the symptoms worsen or if the condition fails to improve as anticipated.  I provided 21 minutes of non-face-to-face time during this encounter.   Comer LULLA Rouleau, NP 05/22/2024  Pt aware and understands NP's role.   "

## 2024-05-22 NOTE — Patient Instructions (Addendum)
 Continue to use CPAP every night, minimum of 4-6 hours a night.  Change equipment as directed. Wash your tubing with warm soap and water daily, hang to dry. Wash humidifier portion weekly. Use bottled, distilled water and change daily Be aware of reduced alertness and do not drive or operate heavy machinery if experiencing this or drowsiness.  Exercise encouraged, as tolerated. Healthy weight management discussed.  Avoid or decrease alcohol consumption and medications that make you more sleepy, if possible. Notify if persistent daytime sleepiness occurs even with consistent use of PAP therapy.  Change CPAP supplies... Every month Mask cushions and/or nasal pillows CPAP machine filters Every 3 months Mask frame (not including the headgear) CPAP tubing Every 6 months Mask headgear Chin strap (if applicable) Humidifier water tub   Continue to use your oxygen  2 lpm bled through CPAP  Call us  about the mask if you're still having issues getting it from [employer]    Continue Albuterol  inhaler 2 puffs every 6 hours as needed for shortness of breath or wheezing. Notify if symptoms persist despite rescue inhaler/neb use.    Attend follow up with dr. Sherrod as scheduled    Follow up with Dr. Shelah in 6 months. If symptoms do not improve or worsen, please contact office for sooner follow up or seek emergency care.

## 2024-05-22 NOTE — Assessment & Plan Note (Signed)
 Mild. Overall low symptom burden. Continue SABA PRN. Action plan in place.

## 2024-05-22 NOTE — Assessment & Plan Note (Signed)
"   See above.    "

## 2024-05-22 NOTE — Assessment & Plan Note (Signed)
 Stable on prior imaging.  Repeat July 2026. Follow-up with Dr. Gatha next week.

## 2024-06-20 ENCOUNTER — Telehealth (HOSPITAL_BASED_OUTPATIENT_CLINIC_OR_DEPARTMENT_OTHER): Payer: Self-pay | Admitting: *Deleted

## 2024-06-20 NOTE — Telephone Encounter (Signed)
"  ° °  Pre-operative Risk Assessment    Patient Name: ELESHIA WOOLEY  DOB: 03-27-1951 MRN: [id number]   Date of last office visit: 09/07/2021 Date of next office visit: None  Request for Surgical Clearance    Procedure:  Bilateral lower punctoplasty and RLL lesion excision and repair with biopsy  Date of Surgery:  Clearance 07/24/24                                 Surgeon:  Dr. Renzo Zaidivar Surgeon's Group or Practice Name:  [facility] Phone number:  [phone] Fax number:  [phone]   Type of Clearance Requested:   - Medical  - Pharmacy:  Hold Aspirin  Not indicated.    Type of Anesthesia:  Local    Additional requests/questions:    Signed, Edsel Grayce Sanders   06/20/2024, 12:45 PM   "

## 2024-06-21 NOTE — Telephone Encounter (Signed)
 1st attempt to reach pt regarding surgical clearance and the need for an IN OFFICE appointment.  Left pt a detailed message to call back and get that scheduled.

## 2024-06-21 NOTE — Telephone Encounter (Signed)
" ° °  Name: Kathryn [name]  DOB: 04-Jul-1950  MRN: [id number]  Primary Cardiologist: Lynwood Schilling, MD  Chart reviewed as part of pre-operative protocol coverage. Because of Kathryn [name] past medical history and time since last visit, she will require a follow-up in-office visit in order to better assess preoperative cardiovascular risk.  Pre-op covering staff: - Please schedule appointment and call patient to inform them. If patient already had an upcoming appointment within acceptable timeframe, please add pre-op clearance to the appointment notes so provider is aware. - Please contact requesting surgeon's office via preferred method (i.e, phone, fax) to inform them of need for appointment prior to surgery.  She has not been seen in almost 3 years.  Aspirin  hold can be addressed at the time of that office visit.  Kathryn LOISE Fabry, PA-C  06/21/2024, 8:01 AM   "

## 2024-06-21 NOTE — Telephone Encounter (Signed)
 Patient already has a scheduled appointment with Damien Braver on 07/17/2024. I will update appointment notes.

## 2024-07-17 ENCOUNTER — Ambulatory Visit: Admitting: Nurse Practitioner

## 2024-11-26 ENCOUNTER — Other Ambulatory Visit

## 2024-12-05 ENCOUNTER — Ambulatory Visit: Admitting: Internal Medicine

## 2024-12-05 ENCOUNTER — Other Ambulatory Visit
